# Patient Record
Sex: Male | Born: 1950 | Race: White | Hispanic: No | Marital: Married | Smoking: Current every day smoker
Health system: Southern US, Community
[De-identification: ages and names within clinical notes are randomized; demographics above are authoritative.]

## PROBLEM LIST (undated history)

## (undated) DIAGNOSIS — I209 Angina pectoris, unspecified: Secondary | ICD-10-CM

## (undated) DIAGNOSIS — C801 Malignant (primary) neoplasm, unspecified: Secondary | ICD-10-CM

## (undated) DIAGNOSIS — E039 Hypothyroidism, unspecified: Secondary | ICD-10-CM

## (undated) DIAGNOSIS — Z973 Presence of spectacles and contact lenses: Secondary | ICD-10-CM

## (undated) DIAGNOSIS — I2510 Atherosclerotic heart disease of native coronary artery without angina pectoris: Secondary | ICD-10-CM

## (undated) DIAGNOSIS — R490 Dysphonia: Secondary | ICD-10-CM

## (undated) DIAGNOSIS — M1990 Unspecified osteoarthritis, unspecified site: Secondary | ICD-10-CM

## (undated) DIAGNOSIS — I10 Essential (primary) hypertension: Secondary | ICD-10-CM

## (undated) DIAGNOSIS — I219 Acute myocardial infarction, unspecified: Secondary | ICD-10-CM

## (undated) DIAGNOSIS — R1310 Dysphagia, unspecified: Secondary | ICD-10-CM

## (undated) DIAGNOSIS — Z87891 Personal history of nicotine dependence: Secondary | ICD-10-CM

## (undated) HISTORY — DX: Personal history of nicotine dependence: Z87.891

## (undated) HISTORY — PX: BILATERAL CARPAL TUNNEL RELEASE: SHX6508

## (undated) HISTORY — PX: CORONARY ANGIOPLASTY WITH STENT PLACEMENT: SHX49

## (undated) HISTORY — PX: BACK SURGERY: SHX140

---

## 2004-07-12 ENCOUNTER — Inpatient Hospital Stay (HOSPITAL_COMMUNITY): Admission: EM | Admit: 2004-07-12 | Discharge: 2004-07-22 | Payer: Self-pay | Admitting: Cardiovascular Disease

## 2004-07-12 HISTORY — PX: CARDIAC CATHETERIZATION: SHX172

## 2004-07-17 HISTORY — PX: CORONARY ARTERY BYPASS GRAFT: SHX141

## 2004-08-13 ENCOUNTER — Encounter
Admission: RE | Admit: 2004-08-13 | Discharge: 2004-08-13 | Payer: Self-pay | Admitting: Thoracic Surgery (Cardiothoracic Vascular Surgery)

## 2006-03-10 ENCOUNTER — Ambulatory Visit: Payer: Self-pay | Admitting: Cardiology

## 2013-10-14 ENCOUNTER — Encounter (HOSPITAL_COMMUNITY)
Admission: AD | Disposition: A | Discharge status: Home / Self Care | Payer: Medicare HMO | Source: Other Acute Inpatient Hospital | Attending: Cardiology

## 2013-10-14 ENCOUNTER — Encounter (HOSPITAL_COMMUNITY): Payer: Self-pay

## 2013-10-14 ENCOUNTER — Inpatient Hospital Stay (HOSPITAL_COMMUNITY)
Admission: AD | Admit: 2013-10-14 | Discharge: 2013-10-15 | DRG: 247 | Disposition: A | Discharge status: Home / Self Care | Payer: Medicare HMO | Source: Other Acute Inpatient Hospital | Attending: Cardiology | Admitting: Cardiology

## 2013-10-14 DIAGNOSIS — I2582 Chronic total occlusion of coronary artery: Secondary | ICD-10-CM | POA: Diagnosis present

## 2013-10-14 DIAGNOSIS — Z87891 Personal history of nicotine dependence: Secondary | ICD-10-CM

## 2013-10-14 DIAGNOSIS — I2510 Atherosclerotic heart disease of native coronary artery without angina pectoris: Secondary | ICD-10-CM | POA: Diagnosis present

## 2013-10-14 DIAGNOSIS — I9589 Other hypotension: Secondary | ICD-10-CM | POA: Diagnosis not present

## 2013-10-14 DIAGNOSIS — Z79899 Other long term (current) drug therapy: Secondary | ICD-10-CM

## 2013-10-14 DIAGNOSIS — T463X5A Adverse effect of coronary vasodilators, initial encounter: Secondary | ICD-10-CM | POA: Diagnosis not present

## 2013-10-14 DIAGNOSIS — E785 Hyperlipidemia, unspecified: Secondary | ICD-10-CM | POA: Diagnosis present

## 2013-10-14 DIAGNOSIS — I214 Non-ST elevation (NSTEMI) myocardial infarction: Principal | ICD-10-CM | POA: Diagnosis present

## 2013-10-14 DIAGNOSIS — I2589 Other forms of chronic ischemic heart disease: Secondary | ICD-10-CM | POA: Diagnosis present

## 2013-10-14 DIAGNOSIS — I25810 Atherosclerosis of coronary artery bypass graft(s) without angina pectoris: Secondary | ICD-10-CM | POA: Diagnosis present

## 2013-10-14 DIAGNOSIS — Z951 Presence of aortocoronary bypass graft: Secondary | ICD-10-CM

## 2013-10-14 DIAGNOSIS — I255 Ischemic cardiomyopathy: Secondary | ICD-10-CM | POA: Diagnosis present

## 2013-10-14 DIAGNOSIS — I10 Essential (primary) hypertension: Secondary | ICD-10-CM | POA: Diagnosis present

## 2013-10-14 DIAGNOSIS — Z880 Allergy status to penicillin: Secondary | ICD-10-CM

## 2013-10-14 DIAGNOSIS — E039 Hypothyroidism, unspecified: Secondary | ICD-10-CM | POA: Diagnosis present

## 2013-10-14 DIAGNOSIS — Z7902 Long term (current) use of antithrombotics/antiplatelets: Secondary | ICD-10-CM

## 2013-10-14 DIAGNOSIS — Z9119 Patient's noncompliance with other medical treatment and regimen: Secondary | ICD-10-CM

## 2013-10-14 DIAGNOSIS — Y9210 Unspecified residential institution as the place of occurrence of the external cause: Secondary | ICD-10-CM | POA: Diagnosis not present

## 2013-10-14 DIAGNOSIS — I252 Old myocardial infarction: Secondary | ICD-10-CM

## 2013-10-14 DIAGNOSIS — Z91199 Patient's noncompliance with other medical treatment and regimen due to unspecified reason: Secondary | ICD-10-CM

## 2013-10-14 DIAGNOSIS — Z7982 Long term (current) use of aspirin: Secondary | ICD-10-CM

## 2013-10-14 HISTORY — DX: Unspecified osteoarthritis, unspecified site: M19.90

## 2013-10-14 HISTORY — DX: Acute myocardial infarction, unspecified: I21.9

## 2013-10-14 HISTORY — PX: LEFT HEART CATHETERIZATION WITH CORONARY ANGIOGRAM: SHX5451

## 2013-10-14 HISTORY — DX: Essential (primary) hypertension: I10

## 2013-10-14 HISTORY — DX: Hypothyroidism, unspecified: E03.9

## 2013-10-14 HISTORY — DX: Angina pectoris, unspecified: I20.9

## 2013-10-14 HISTORY — DX: Atherosclerotic heart disease of native coronary artery without angina pectoris: I25.10

## 2013-10-14 LAB — LIPID PANEL
Cholesterol: 145 mg/dL (ref 0–200)
HDL: 35 mg/dL — ABNORMAL LOW (ref >39)
LDL CALC: 87 mg/dL (ref 0–99)
TRIGLYCERIDES: 114 mg/dL (ref <150)
Total CHOL/HDL Ratio: 4.1 RATIO
VLDL: 23 mg/dL (ref 0–40)

## 2013-10-14 LAB — BASIC METABOLIC PANEL
BUN: 15 mg/dL (ref 6–23)
CO2: 22 meq/L (ref 19–32)
Calcium: 9 mg/dL (ref 8.4–10.5)
Chloride: 102 mEq/L (ref 96–112)
Creatinine, Ser: 0.68 mg/dL (ref 0.50–1.35)
GFR calc Af Amer: >90 mL/min (ref >90)
Glucose, Bld: 106 mg/dL — ABNORMAL HIGH (ref 70–99)
POTASSIUM: 4.8 meq/L (ref 3.7–5.3)
SODIUM: 139 meq/L (ref 137–147)

## 2013-10-14 LAB — CBC
HEMATOCRIT: 40.9 % (ref 39.0–52.0)
Hemoglobin: 14.4 g/dL (ref 13.0–17.0)
MCH: 33 pg (ref 26.0–34.0)
MCHC: 35.2 g/dL (ref 30.0–36.0)
MCV: 93.6 fL (ref 78.0–100.0)
Platelets: 202 10*3/uL (ref 150–400)
RBC: 4.37 MIL/uL (ref 4.22–5.81)
RDW: 12.6 % (ref 11.5–15.5)
WBC: 12.1 10*3/uL — AB (ref 4.0–10.5)

## 2013-10-14 LAB — PROTIME-INR
INR: 1.11 (ref 0.00–1.49)
Prothrombin Time: 14.1 seconds (ref 11.6–15.2)

## 2013-10-14 LAB — HEMOGLOBIN A1C
Hgb A1c MFr Bld: 6 % — ABNORMAL HIGH (ref <5.7)
MEAN PLASMA GLUCOSE: 126 mg/dL — AB (ref <117)

## 2013-10-14 LAB — MRSA PCR SCREENING: MRSA by PCR: NEGATIVE

## 2013-10-14 LAB — POCT ACTIVATED CLOTTING TIME
ACTIVATED CLOTTING TIME: 160 s
Activated Clotting Time: 343 seconds

## 2013-10-14 LAB — TSH: TSH: 0.755 u[IU]/mL (ref 0.350–4.500)

## 2013-10-14 LAB — TROPONIN I: Troponin I: 4.3 ng/mL (ref <0.30)

## 2013-10-14 LAB — HEPARIN LEVEL (UNFRACTIONATED): Heparin Unfractionated: 0.18 IU/mL — ABNORMAL LOW (ref 0.30–0.70)

## 2013-10-14 LAB — APTT: APTT: 56 s — AB (ref 24–37)

## 2013-10-14 SURGERY — LEFT HEART CATHETERIZATION WITH CORONARY ANGIOGRAM
Anesthesia: LOCAL

## 2013-10-14 MED ORDER — TICAGRELOR 90 MG PO TABS
180.0000 mg | ORAL_TABLET | Freq: Once | ORAL | Status: AC
  Administered 2013-10-14: 180 mg via ORAL
  Filled 2013-10-14: qty 2

## 2013-10-14 MED ORDER — MIDAZOLAM HCL 2 MG/2ML IJ SOLN
INTRAMUSCULAR | Status: AC
  Filled 2013-10-14: qty 2

## 2013-10-14 MED ORDER — NITROGLYCERIN 0.4 MG SL SUBL: 0.4000 mg | SUBLINGUAL_TABLET | SUBLINGUAL | Status: DC | PRN

## 2013-10-14 MED ORDER — HEPARIN (PORCINE) IN NACL 2-0.9 UNIT/ML-% IJ SOLN
INTRAMUSCULAR | Status: AC
  Filled 2013-10-14: qty 1500

## 2013-10-14 MED ORDER — SODIUM CHLORIDE 0.9 % IV SOLN
0.2500 mg/kg/h | INTRAVENOUS | Status: DC
  Administered 2013-10-14: 0.25 mg/kg/h via INTRAVENOUS
  Filled 2013-10-14: qty 250

## 2013-10-14 MED ORDER — SODIUM CHLORIDE 0.9 % IJ SOLN
3.0000 mL | Freq: Two times a day (BID) | INTRAMUSCULAR | Status: DC
  Administered 2013-10-14 (×2): 3 mL via INTRAVENOUS

## 2013-10-14 MED ORDER — HEPARIN (PORCINE) IN NACL 100-0.45 UNIT/ML-% IJ SOLN
1550.0000 [IU]/h | INTRAMUSCULAR | Status: DC
  Filled 2013-10-14: qty 250

## 2013-10-14 MED ORDER — BIVALIRUDIN 250 MG IV SOLR
INTRAVENOUS | Status: AC
  Filled 2013-10-14: qty 250

## 2013-10-14 MED ORDER — SODIUM CHLORIDE 0.9 % IJ SOLN: 3.0000 mL | INTRAMUSCULAR | Status: DC | PRN

## 2013-10-14 MED ORDER — NITROGLYCERIN IN D5W 200-5 MCG/ML-% IV SOLN
2.0000 ug/min | INTRAVENOUS | Status: DC
  Administered 2013-10-14: 10 ug/min via INTRAVENOUS
  Administered 2013-10-15: 20 ug/min via INTRAVENOUS
  Filled 2013-10-14: qty 250

## 2013-10-14 MED ORDER — ASPIRIN EC 81 MG PO TBEC
81.0000 mg | DELAYED_RELEASE_TABLET | Freq: Every day | ORAL | Status: DC
  Administered 2013-10-15: 81 mg via ORAL
  Filled 2013-10-14: qty 1

## 2013-10-14 MED ORDER — MORPHINE SULFATE 2 MG/ML IJ SOLN
INTRAMUSCULAR | Status: AC
  Administered 2013-10-14: 2 mg via INTRAVENOUS
  Filled 2013-10-14: qty 1

## 2013-10-14 MED ORDER — TICAGRELOR 90 MG PO TABS: 90.0000 mg | ORAL_TABLET | Freq: Two times a day (BID) | ORAL | Status: DC

## 2013-10-14 MED ORDER — MORPHINE SULFATE 2 MG/ML IJ SOLN
2.0000 mg | Freq: Once | INTRAMUSCULAR | Status: AC
  Administered 2013-10-14: 2 mg via INTRAVENOUS

## 2013-10-14 MED ORDER — NITROGLYCERIN 0.2 MG/ML ON CALL CATH LAB
INTRAVENOUS | Status: AC
  Filled 2013-10-14: qty 1

## 2013-10-14 MED ORDER — SODIUM CHLORIDE 0.9 % IV SOLN
250.0000 mL | INTRAVENOUS | Status: DC | PRN
  Administered 2013-10-14: 250 mL via INTRAVENOUS

## 2013-10-14 MED ORDER — ACETAMINOPHEN 325 MG PO TABS
650.0000 mg | ORAL_TABLET | ORAL | Status: DC | PRN
Start: 2013-10-14 — End: 2013-10-15
  Administered 2013-10-14: 650 mg via ORAL
  Filled 2013-10-14: qty 2

## 2013-10-14 MED ORDER — ATORVASTATIN CALCIUM 80 MG PO TABS
80.0000 mg | ORAL_TABLET | Freq: Every day | ORAL | Status: DC
  Administered 2013-10-14: 80 mg via ORAL
  Filled 2013-10-14 (×2): qty 1

## 2013-10-14 MED ORDER — ACETAMINOPHEN 325 MG PO TABS: 650.0000 mg | ORAL_TABLET | ORAL | Status: DC | PRN

## 2013-10-14 MED ORDER — FENTANYL CITRATE 0.05 MG/ML IJ SOLN
INTRAMUSCULAR | Status: AC
  Filled 2013-10-14: qty 2

## 2013-10-14 MED ORDER — HEPARIN (PORCINE) IN NACL 100-0.45 UNIT/ML-% IJ SOLN
1250.0000 [IU]/h | INTRAMUSCULAR | Status: DC
  Administered 2013-10-14: 1250 [IU]/h via INTRAVENOUS
  Filled 2013-10-14: qty 250

## 2013-10-14 MED ORDER — LIDOCAINE HCL (PF) 1 % IJ SOLN
INTRAMUSCULAR | Status: AC
  Filled 2013-10-14: qty 30

## 2013-10-14 MED ORDER — ONDANSETRON HCL 4 MG/2ML IJ SOLN
4.0000 mg | Freq: Four times a day (QID) | INTRAMUSCULAR | Status: DC | PRN
Start: 2013-10-14 — End: 2013-10-14

## 2013-10-14 MED ORDER — SODIUM CHLORIDE 0.9 % IV SOLN: 250.0000 mL | INTRAVENOUS | Status: DC | PRN

## 2013-10-14 MED ORDER — LEVOTHYROXINE SODIUM 150 MCG PO TABS
150.0000 ug | ORAL_TABLET | Freq: Every day | ORAL | Status: DC
Start: 2013-10-14 — End: 2013-10-15
  Administered 2013-10-14 – 2013-10-15 (×2): 150 ug via ORAL
  Filled 2013-10-14 (×3): qty 1

## 2013-10-14 MED ORDER — ONDANSETRON HCL 4 MG/2ML IJ SOLN: 4.0000 mg | Freq: Four times a day (QID) | INTRAMUSCULAR | Status: DC | PRN

## 2013-10-14 MED ORDER — SODIUM CHLORIDE 0.9 % IV SOLN
INTRAVENOUS | Status: DC
  Administered 2013-10-14: 17:00:00 via INTRAVENOUS

## 2013-10-14 MED ORDER — SODIUM CHLORIDE 0.9 % IJ SOLN: 3.0000 mL | Freq: Two times a day (BID) | INTRAMUSCULAR | Status: DC

## 2013-10-14 MED ORDER — NITROGLYCERIN IN D5W 200-5 MCG/ML-% IV SOLN
2.0000 ug/min | INTRAVENOUS | Status: DC
  Administered 2013-10-14: 40 ug/min via INTRAVENOUS

## 2013-10-14 MED ORDER — HEPARIN BOLUS VIA INFUSION
2500.0000 [IU] | Freq: Once | INTRAVENOUS | Status: AC
  Administered 2013-10-14: 2500 [IU] via INTRAVENOUS
  Filled 2013-10-14: qty 2500

## 2013-10-14 MED ORDER — ASPIRIN EC 81 MG PO TBEC: 81.0000 mg | DELAYED_RELEASE_TABLET | Freq: Every day | ORAL | Status: DC

## 2013-10-14 MED ORDER — SODIUM CHLORIDE 0.9 % IJ SOLN
3.0000 mL | INTRAMUSCULAR | Status: DC | PRN
Start: 2013-10-14 — End: 2013-10-14

## 2013-10-14 MED ORDER — SODIUM CHLORIDE 0.9 % IV SOLN
1.0000 mL/kg/h | INTRAVENOUS | Status: DC
  Administered 2013-10-14: 1 mL/kg/h via INTRAVENOUS

## 2013-10-14 MED ORDER — TICAGRELOR 90 MG PO TABS
90.0000 mg | ORAL_TABLET | Freq: Two times a day (BID) | ORAL | Status: DC
  Administered 2013-10-14 – 2013-10-15 (×3): 90 mg via ORAL
  Filled 2013-10-14 (×4): qty 1

## 2013-10-14 MED FILL — Nitroglycerin IV Soln 200 MCG/ML in D5W: INTRAVENOUS | Qty: 250 | Status: AC

## 2013-10-14 MED FILL — Heparin Sodium (Porcine) 100 Unt/ML in Sodium Chloride 0.45%: INTRAMUSCULAR | Qty: 250 | Status: AC

## 2013-10-14 NOTE — Consult Note (Deleted)
12 lead ekg performed. Critical value noted. Jennye Moccasin RN notified

## 2013-10-14 NOTE — CV Procedure (Signed)
Jerry Cardenas is a 63 y.o. male    [id number]  [id number] LOCATION:  FACILITY: [facility]  PHYSICIAN: Jerry Sine, MD, Jerry Cardenas 11-10-1950   DATE OF PROCEDURE:  10/14/2013    [facility] CATHETERIZATION     HISTORY:  [name] is a 63 year old male with a past has seen [facility]. The patient underwent CABG revascularization Jerry in 2005 by [facility] and had a LIMA placed to his LAD, and SVG to diagonal, and SVG to the circumflex marginal vessel, and an SVG to his distal right coronary artery. He had not seen [facility] in several years. He presented to [facility] yesterday with 3 days of stuttering chest pain and diffuse less than 1 mm ST segment depression. Troponin was positive for non-ST segment elevation MI. He was transported to [facility] and was scheduled for definitive [facility] catheterization today.   PROCEDURE:  The patient was brought to the second floor [facility] cath lab in the postabsorptive state. He was premedicated with Versed 2 mg and fentanyl 50 mcg. His right groin was prepped and shaved in usual sterile fashion. A 5 French sheath was inserted into the right femoral artery without difficulty. Diagnostic catheterization was done using 5 French Judkins 4 left and right catheters. The right coronary catheter was used for selective angiography into the 3 vein grafts arising from the aorta. A LIMA catheter was necessary for selective angiography into the left internal mammary artery. A 5 French pigtail catheter was used for RAO ventriculography.  With the demonstration of an apparent very recent occlusion of the vein graft supplying the right coronary artery with no visualization beyond the mid segment but with vascillating flow  suggestive of distal occlusion the decision was made to attempt intervention. The sheath was upgraded to a 6 [place] system. Angiomax bolus plus infusion was administered. Apparently, the patient had already received  a180 mg loading dose of Brilinta last night and later did receive an additional 90 mg oral dose. He also had received aspirin. A 6 F right guiding catheter was used after a right bypass graft catheter could not optimally engaged the graft. A  prowater wire was advanced into the SVG but this was unable to cross the distal occlusion. A light support Choice PT wire was then used and successfully able to cross the total distal occlusion with support from a 2.0x12 mm Emerge Monorail balloon. Several dilatations were done at the site of distal occlusion of the 5 atmospheres. This was then upgraded to a 2.5x20 mm balloon and 4 inflations to 10 atmospheres were made. There was an ectatic segment just proximal to the high-grade residual narrowing it was felt that the stent to be inserted would need to not being placed in this ectatic segment. A 3.0x33 mm findings outlined DES stent was then inserted and 2 inflations at 13 and 14 atmospheres were performed. A 3.25x20 mm [facility] Euphora balloon was used for post dilatation up to approximately 3.25 mm. Scout angiography confirmed an excellent angiographic result. During the procedure the patient  was started on intravenous nitroglycerin and titrated up to 30 mcg and also received numerous 200 mcg injections selectively into the SVG to the RCA. He left the catheterization laboratory with stable hemodynamics and chest pain-free. The Angiomax dose was reduced with plans to continue this reduced dose for 2 hours prior to sheath removal.  HEMODYNAMICS:   Central aortic pressure:  166/86    Left ventricular pressure:  166/16/28  ANGIOGRAPHY:  1. Left Main: Angiographically normal and extended into the LAD which gave rise to a very proximal LAD collateral to the circumflex. 2. LAD: Diffuse narrowing of 80% before the first septal perforating artery and small first diagonal vessel and then the LAD was totally occluded after the septal perforating artery. 3. Left circumflex:  Occluded at the ostium arising from the left main; however, there was a faint collateral seen to arise from an very small diagonal like branch which then anastomosed into the AV groove circumflex. There was distal collateralization to the RCA from the distal circumflex. The OM branch arising from the mid circumflex was not visualized from the AV groove circumflex. 4. Right coronary artery: Diffusely diseased proximal vessel with mid occlusion after an ectatic segment and entry into RV marginal branch.  5.LIMA TO LAD:  Widely patent and anastomosed to the mid LAD the distal LAD was free of significant disease and extended to the LV apex. There was retrograde filling of the LAD proximal to the graft anastomosis which also supplied septal perforating artery. 6. SVG TO diagonal vessel was widely patent. 7. SVG TO obtuse marginal vessel was widely patent. 8. SVG TO the right radial artery was occluded. Flow stopped in the mid segment but there was an ebh and flow  in this mid segment suggesting a more distal occlusion     Left ventriculography revealed global ejection fraction of approximately 40%. There was severe hypokinesis involving the inferior wall from the basal segment to the mid region.   Following successful percutaneous coronary intervention to the saphenous vein graft to the RCA with initial PTCA utilizing a 2.0x12 mm Emerge balloon upgraded to a 2.5x12 mm balloon, a 3.0x33 mm Xience Alpine DES stent was inserted in the distal aspect of the graft and was positioned just beyond an ectatic segment and was post dilated successfully to 3.25 mm with the residual stenosis being reduced to 0% and brisk TIMI-3 flow. The proximal portion of the vein graft did have diffuse irregularity and areas of ectasia there was a 60% narrowing in the midsegment proximal to another ectatic segment which was not intervened upon.  IMPRESSION:  Moderate LV dysfunction with severe hypo-kinesis involving the basal to mid  inferior wall with a ejection fraction of approximately 40%.  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  Patent left internal mammary artery graft supplying the mid LAD   Patent saphenous vein graft supplying the diagonal vessel  Patent saphenous vein graft supplying the circumflex marginal vessel  Recently occluded saphenous vein graft supplying the distal RCA.  Successful percutaneous coronary intervention to the vein graft supplying the RCA with PTCA/DES stenting with a 3.0x33 mm Xience Alpine DES stent postdilated 3.25 mm with distal 100% occlusion and TIMI 0 flow being reduced to 0% and TIMI 3 flow, and evidence for diffuse disease in the proximal to mid segment with areas of ectasia and narrowing of 60% in the mid vessel which was not intervened upon.  Anticoagulation/antiplatelet therapy with bivalirudin/brilinta in addition to IC/IV nitroglycerin.  Jerry Sine, MD, [facility] 10/14/2013 4:57 PM

## 2013-10-14 NOTE — H&P (Signed)
Admission History and Physical     Patient ID: Jerry Cardenas, MRN: [id number], DOB: 14-Dec-1950 63 y.o. Date of Encounter: 10/14/2013, 1:16 AM  Primary Physician: No primary provider on file. Primary Cardiologist: None  Chief Complaint: Chest Pain  History of Present Illness: Jerry Cardenas is a 63 y.o. male s/p CABG done at [facility] in 2005, who presents with 3 days of stuttering chest pain, found to have NSTEMI.  He states he was in his usual state of health until 3 days ago, when he noticed the onset of his chest pain, described as a burning indigestion, similar to chest discomfort at time of CABG.  Stuttering, and not necessarily worse with exertion, but did get worse over time and radiated to his jaw and arm.  He presented to [facility] ED where his EKG showed diffuse <48mm ST depression, and Troponin was positive.  Persistent chest pain requiring NTG gtt.  He initially became hypotensive when NTG was started but recovered with a fluid bolus.  Heparin gtt started as well, aspirin given.  On arrival to [facility], he has very mild chest pain (indigestion) with stable vitals, but with headache from NTG.  He denies GI bleeding, SOB, N/V, F/C, orthopnea, edema, or PND.  He is typically fairly active, doing yard work, Social research officer, government.  He lives with his wife at home.  He smokes 5 cigars per day, no EtOH.  Of note he does not have a cardiologist currently, and has not been on aspirin for quite some time.   He does take meloxicam for pain.  His last stress test was ~ 2010 and normal.   Past Medical History  Diagnosis Date  . Arthritis   . Hypertension   . Coronary artery disease   . Myocardial infarction   . Hypothyroidism   . Anginal pain      Past Surgical History  Procedure Laterality Date  . Coronary artery bypass graft    . Cardiac catheterization    . Back surgery        Current Facility-Administered Medications  Medication Dose Route Frequency Provider Last Rate Last Dose  .  acetaminophen (TYLENOL) tablet 650 mg  650 mg Oral Q4H PRN Stephani Police, MD      . Derrill Memo ON 10/15/2013] aspirin EC tablet 81 mg  81 mg Oral Daily Stephani Police, MD      . atorvastatin (LIPITOR) tablet 80 mg  80 mg Oral q1800 Stephani Police, MD      . nitroGLYCERIN 0.2 mg/mL in dextrose 5 % infusion  2-200 mcg/min Intravenous Titrated Stephani Police, MD      . ondansetron [facility]) injection 4 mg  4 mg Intravenous Q6H PRN Stephani Police, MD      . Ticagrelor [facility]) tablet 180 mg  180 mg Oral Once Stephani Police, MD      . Ticagrelor [facility]) tablet 90 mg  90 mg Oral BID Stephani Police, MD          Allergies: Allergies  Allergen Reactions  . Penicillins     Swelling in extremeties     Social History:  The patient  reports that he quit smoking about 15 years ago. He has quit using smokeless tobacco. He reports that he does not use illicit drugs.   Family History:  The patient's family history is not on file.   ROS:  Please see the history of present illness.   All other systems reviewed and negative.   Vital Signs: Temperature 98.4 F (36.9  C), temperature source Oral, height 6' (1.829 m), weight 107.3 kg (236 lb 8.9 oz).  PHYSICAL EXAM: General:  Well nourished, well developed, in no acute distress HEENT: normal Lymph: no adenopathy Neck: no JVD Endocrine:  No thryomegaly Vascular: No carotid bruits; FA pulses 2+ bilaterally without bruits Cardiac:  normal S1, S2; RRR; no murmur Lungs:  clear to auscultation bilaterally, no wheezing, rhonchi or rales Abd: soft, nontender, no hepatomegaly Ext: no edema Musculoskeletal:  No deformities, BUE and BLE strength normal and equal Skin: warm and dry Neuro:  CNs 2-12 intact, no focal abnormalities noted Psych:  Normal affect   EKG:  (From [place]) NSR, <40mm diffuse ST depression, 70mm elevation in AVR.  Labs [facility]):  Na 134 K 3.9 BUN 17 Cr 0.88 Mg 1.9 Trop I:  0.47 (ULN 0.03) CKMB 9.9, CK 139 BNP 59 INR 1.1, PT 14.5, PTT  33.3 WBC 11.5 Hgb 15.5 PLT 224  Radiology/Studies:  No results found.   ASSESSMENT AND PLAN:   1. NSTEMI S/P CABG in 2005 - His CABG was performed here at [facility] so hopefully we have access to Op report, but I do not see anything in epic. - Will plan on cath later today.  He is NPO. - Continue Heparin gtt, ASA 81 daily. - Start Ticagrelor - NTG gtt, titrate to effect.  Tylenol PRN associated headache.   - Repeat labs including Troponin in AM   Signed,  Stephani Police, MD 10/14/2013, 1:16 AM

## 2013-10-14 NOTE — Progress Notes (Signed)
ANTICOAGULATION CONSULT NOTE - Initial Consult  Pharmacy Consult for Heparin Indication: chest pain/ACS  Allergies  Allergen Reactions  . Penicillins     Swelling in extremeties    Patient Measurements: Height: 6' (182.9 cm) Weight: 236 lb 8.9 oz (107.3 kg) IBW/kg (Calculated) : 77.6 Heparin Dosing Weight: 100 kg  Vital Signs: Temp: 98.4 F (36.9 C) (01/29 0000) Temp src: Oral (01/29 0000)  Labs (at [facility]): WBC  11.5 Hgb  15.5 Hct  45.1 Plt  223  SCr  0.88  No results found for this basename: HGB, HCT, PLT, APTT, LABPROT, INR, HEPARINUNFRC, CREATININE, CKTOTAL, CKMB, TROPONINI,  in the last 72 hours  CrCl is unknown because no creatinine reading has been taken.   Medical History: Past Medical History  Diagnosis Date  . Arthritis   . Hypertension   . Coronary artery disease   . Myocardial infarction   . Hypothyroidism   . Anginal pain     Medications:  Mobic  Coreg  Lipitor  Synthroid    Assessment: 63 yo male with NSTEMI for heparin.  Heparin 5000 units IV bolus, 1000 units/hr started at [facility] ~ 10pm  Goal of Therapy:  Heparin level 0.3-0.7 units/ml Monitor platelets by anticoagulation protocol: Yes   Plan:  Increase Heparin 1250 units/hr  Zoejane Gaulin, Bronson Curb 10/14/2013,1:26 AM

## 2013-10-14 NOTE — Interval H&P Note (Signed)
History and Physical Interval Note:  10/14/2013 1:26 PM  Royetta Asal  has presented today for surgery, with the diagnosis of cp  The various methods of treatment have been discussed with the patient and family. After consideration of risks, benefits and other options for treatment, the patient has consented to  Procedure(s): LEFT HEART CATHETERIZATION WITH CORONARY ANGIOGRAM (N/A) and possible PCI Cath Lab Visit (complete for each Cath Lab visit)  Clinical Evaluation Leading to the Procedure:   ACS: yes  Non-ACS:    Anginal Classification: CCS IV  Anti-ischemic medical therapy: Maximal Therapy (2 or more classes of medications)  Non-Invasive Test Results: No non-invasive testing performed  Prior CABG: Previous CABG      as a surgical intervention .  The patient's history has been reviewed, patient examined, no change in status, stable for surgery.  I have reviewed the patient's chart and labs.  Questions were answered to the patient's satisfaction.     KELLY,THOMAS A

## 2013-10-14 NOTE — H&P (View-Only) (Signed)
DAILY PROGRESS NOTE  Subjective:  No events overnight. Troponin elevated to 4.3.  He has been having chest pain for several days. Has not seen a cardiologist in follow-up in "years" since his CABG in 2005. Non-compliant with PCP follow-ups as well.  Currently he is chest pain free on heparin and nitroglycerin gtts.  Objective:  Temp:  [97.9 F (36.6 C)-98.4 F (36.9 C)] 97.9 F (36.6 C) (01/29 0700) Pulse Rate:  [56-70] 69 (01/29 0800) Resp:  [12-22] 14 (01/29 0800) BP: (105-153)/(56-81) 153/73 mmHg (01/29 0800) SpO2:  [95 %-99 %] 97 % (01/29 0800) Weight:  [236 lb 8.9 oz (107.3 kg)] 236 lb 8.9 oz (107.3 kg) (01/29 0000) Weight change:   Intake/Output from previous day: 01/28 0701 - 01/29 0700 In: 95.5 [I.V.:95.5] Out: 400 [Urine:400]  Intake/Output from this shift: Total I/O In: -  Out: 400 [Urine:400]  Medications: Current Facility-Administered Medications  Medication Dose Route Frequency Provider Last Rate Last Dose  . 0.9 %  sodium chloride infusion  250 mL Intravenous PRN Stephani Police, MD 10 mL/hr at 10/14/13 0208 250 mL at 10/14/13 0208  . acetaminophen (TYLENOL) tablet 650 mg  650 mg Oral Q4H PRN Stephani Police, MD   650 mg at 10/14/13 0127  . [START ON 10/15/2013] aspirin EC tablet 81 mg  81 mg Oral Daily Stephani Police, MD      . atorvastatin (LIPITOR) tablet 80 mg  80 mg Oral q1800 Stephani Police, MD      . heparin ADULT infusion 100 units/mL (25000 units/250 mL)  1,550 Units/hr Intravenous Continuous Rocky Crafts Hammons, RPH 15.5 mL/hr at 10/14/13 0931 1,550 Units/hr at 10/14/13 0931  . levothyroxine (SYNTHROID, LEVOTHROID) tablet 150 mcg  150 mcg Oral QAC breakfast Stephani Police, MD   150 mcg at 10/14/13 0834  . nitroGLYCERIN 0.2 mg/mL in dextrose 5 % infusion  2-200 mcg/min Intravenous Titrated Stephani Police, MD 1.5 mL/hr at 10/14/13 0210 5 mcg/min at 10/14/13 0210  . ondansetron (ZOFRAN) injection 4 mg  4 mg Intravenous Q6H PRN Stephani Police, MD      . sodium  chloride 0.9 % injection 3 mL  3 mL Intravenous Q12H Stephani Police, MD   3 mL at 10/14/13 0933  . sodium chloride 0.9 % injection 3 mL  3 mL Intravenous PRN Stephani Police, MD      . Ticagrelor [facility]) tablet 90 mg  90 mg Oral BID Stephani Police, MD   90 mg at 10/14/13 4827    Physical Exam: General appearance: alert and no distress Neck: no carotid bruit and no JVD Lungs: clear to auscultation bilaterally Heart: regular rate and rhythm, S1, S2 normal, no murmur, click, rub or gallop Abdomen: soft, non-tender; bowel sounds normal; no masses,  no organomegaly Extremities: extremities normal, atraumatic, no cyanosis or edema Pulses: 2+ and symmetric Skin: Skin color, texture, turgor normal. No rashes or lesions Neurologic: Grossly normal Psych: Pleasant  Lab Results: Results for orders placed during the hospital encounter of 10/14/13 (from the past 48 hour(s))  MRSA PCR SCREENING     Status: None   Collection Time    10/14/13 12:18 AM      Result Value Range   MRSA by PCR NEGATIVE  NEGATIVE   Comment:            The GeneXpert MRSA Assay (FDA     approved for NASAL specimens     only), is one component of a     comprehensive MRSA colonization  surveillance program. It is not     intended to diagnose MRSA     infection nor to guide or     monitor treatment for     MRSA infections.  LIPID PANEL     Status: Abnormal   Collection Time    10/14/13  2:20 AM      Result Value Range   Cholesterol 145  0 - 200 mg/dL   Triglycerides 114  <150 mg/dL   HDL 35 (*) >39 mg/dL   Total CHOL/HDL Ratio 4.1     VLDL 23  0 - 40 mg/dL   LDL Cholesterol 87  0 - 99 mg/dL   Comment:            Total Cholesterol/HDL:CHD Risk     Coronary Heart Disease Risk Table                         Men   Women      1/2 Average Risk   3.4   3.3      Average Risk       5.0   4.4      2 X Average Risk   9.6   7.1      3 X Average Risk  23.4   11.0                Use the calculated Patient Ratio     above  and the CHD Risk Table     to determine the patient's CHD Risk.                ATP III CLASSIFICATION (LDL):      <100     mg/dL   Optimal      100-129  mg/dL   Near or Above                        Optimal      130-159  mg/dL   Borderline      160-189  mg/dL   High      >190     mg/dL   Very High  CBC     Status: Abnormal   Collection Time    10/14/13  2:20 AM      Result Value Range   WBC 12.1 (*) 4.0 - 10.5 K/uL   RBC 4.37  4.22 - 5.81 MIL/uL   Hemoglobin 14.4  13.0 - 17.0 g/dL   HCT 40.9  39.0 - 52.0 %   MCV 93.6  78.0 - 100.0 fL   MCH 33.0  26.0 - 34.0 pg   MCHC 35.2  30.0 - 36.0 g/dL   RDW 12.6  11.5 - 15.5 %   Platelets 202  150 - 400 K/uL  BASIC METABOLIC PANEL     Status: Abnormal   Collection Time    10/14/13  2:20 AM      Result Value Range   Sodium 139  137 - 147 mEq/L   Potassium 4.8  3.7 - 5.3 mEq/L   Chloride 102  96 - 112 mEq/L   CO2 22  19 - 32 mEq/L   Glucose, Bld 106 (*) 70 - 99 mg/dL   BUN 15  6 - 23 mg/dL   Creatinine, Ser 0.68  0.50 - 1.35 mg/dL   Calcium 9.0  8.4 - 10.5 mg/dL   GFR calc non Af Amer >90  >90 mL/min   GFR  calc Af Amer >90  >90 mL/min   Comment: (NOTE)     The eGFR has been calculated using the CKD EPI equation.     This calculation has not been validated in all clinical situations.     eGFR's persistently <90 mL/min signify possible Chronic Kidney     Disease.  PROTIME-INR     Status: None   Collection Time    10/14/13  2:20 AM      Result Value Range   Prothrombin Time 14.1  11.6 - 15.2 seconds   INR 1.11  0.00 - 1.49  APTT     Status: Abnormal   Collection Time    10/14/13  2:20 AM      Result Value Range   aPTT 56 (*) 24 - 37 seconds   Comment:            IF BASELINE aPTT IS ELEVATED,     SUGGEST PATIENT RISK ASSESSMENT     BE USED TO DETERMINE APPROPRIATE     ANTICOAGULANT THERAPY.  TROPONIN I     Status: Abnormal   Collection Time    10/14/13  2:20 AM      Result Value Range   Troponin I 4.30 (*) <0.30 ng/mL    Comment:            Due to the release kinetics of cTnI,     a negative result within the first hours     of the onset of symptoms does not rule out     myocardial infarction with certainty.     If myocardial infarction is still suspected,     repeat the test at appropriate intervals.     CRITICAL RESULT CALLED TO, READ BACK BY AND VERIFIED WITH:     ABERION,S RN 10/14/2013 0507 JORDANS  HEPARIN LEVEL (UNFRACTIONATED)     Status: Abnormal   Collection Time    10/14/13  7:45 AM      Result Value Range   Heparin Unfractionated 0.18 (*) 0.30 - 0.70 IU/mL   Comment:            IF HEPARIN RESULTS ARE BELOW     EXPECTED VALUES, AND PATIENT     DOSAGE HAS BEEN CONFIRMED,     SUGGEST FOLLOW UP TESTING     OF ANTITHROMBIN III LEVELS.    Imaging: No results found.  Assessment:  Principal Problem:   NSTEMI (non-ST elevated myocardial infarction) Active Problems:   Hx of CABG   Plan:  1. Will try to obtain records into the EPIC system regarding bypass. Plan for [facility] today for NSTEMI.  He is agreeable to this and NPO. LDL is 87 this am - on lipitor 80 mg.  He has been loaded on Brillinta and aspirin.   Time Spent Directly with Patient:  15 minutes  Length of Stay:  LOS: 0 days   Pixie Casino, MD, [facility] Attending Cardiologist [facility]  Blu Lori C 10/14/2013, 10:33 AM

## 2013-10-14 NOTE — Progress Notes (Signed)
CRITICAL VALUE ALERT  Critical value received:  Troponin 4.30  Date of notification:  10/14/13  Time of notification:  0509  Critical value read back:yes  Nurse who received alert:  Dennison Mascot  MD notified (1st page):  Expected value

## 2013-10-14 NOTE — Progress Notes (Signed)
ANTICOAGULATION CONSULT NOTE - Follow Up Consult  Pharmacy Consult for Heparin Indication: NSTEMI  Allergies  Allergen Reactions  . Penicillins     Swelling in extremeties    Patient Measurements: Height: 6' (182.9 cm) Weight: 236 lb 8.9 oz (107.3 kg) IBW/kg (Calculated) : 77.6 Heparin Dosing Weight: 100 kg  Vital Signs: Temp: 97.9 F (36.6 C) (01/29 0700) Temp src: Oral (01/29 0700) BP: 153/73 mmHg (01/29 0800) Pulse Rate: 69 (01/29 0800)  Labs:  Recent Labs  10/14/13 0220 10/14/13 0745  HGB 14.4  --   HCT 40.9  --   PLT 202  --   APTT 56*  --   LABPROT 14.1  --   INR 1.11  --   HEPARINUNFRC  --  0.18*  CREATININE 0.68  --   TROPONINI 4.30*  --     Estimated Creatinine Clearance: 121.2 ml/min (by C-G formula based on Cr of 0.68).   Medications:  Scheduled:  . [START ON 10/15/2013] aspirin EC  81 mg Oral Daily  . atorvastatin  80 mg Oral q1800  . levothyroxine  150 mcg Oral QAC breakfast  . sodium chloride  3 mL Intravenous Q12H  . Ticagrelor  90 mg Oral BID   Infusions:  . heparin 1,250 Units/hr (10/14/13 0205)  . nitroGLYCERIN 5 mcg/min (10/14/13 0210)    Assessment: 63 yo M transferred from [facility] with elevated troponin and NSTEMI.  Pt was started on heparin at [facility] with infusion rate increased on transfer to [facility].  Heparin level is subtherapeutic on 1250 units/hr.  Will increase heparin infusion accordingly.  Noted start of Ticagrelor and plans for cath today.    Goal of Therapy:  Heparin level 0.3-0.7 units/ml Monitor platelets by anticoagulation protocol: Yes   Plan:  Heparin 2500 unit IV bolus x 1 Increase heparin infusion to 1550 units/hr Heparin level in 6 hours Follow-up after cath  [facility], Pharm.D., BCPS Clinical Pharmacist Pager [phone] 10/14/2013 9:09 AM

## 2013-10-14 NOTE — Progress Notes (Signed)
DAILY PROGRESS NOTE  Subjective:  No events overnight. Troponin elevated to 4.3.  He has been having chest pain for several days. Has not seen a cardiologist in follow-up in "years" since his CABG in 2005. Non-compliant with PCP follow-ups as well.  Currently he is chest pain free on heparin and nitroglycerin gtts.  Objective:  Temp:  [97.9 F (36.6 C)-98.4 F (36.9 C)] 97.9 F (36.6 C) (01/29 0700) Pulse Rate:  [56-70] 69 (01/29 0800) Resp:  [12-22] 14 (01/29 0800) BP: (105-153)/(56-81) 153/73 mmHg (01/29 0800) SpO2:  [95 %-99 %] 97 % (01/29 0800) Weight:  [236 lb 8.9 oz (107.3 kg)] 236 lb 8.9 oz (107.3 kg) (01/29 0000) Weight change:   Intake/Output from previous day: 01/28 0701 - 01/29 0700 In: 95.5 [I.V.:95.5] Out: 400 [Urine:400]  Intake/Output from this shift: Total I/O In: -  Out: 400 [Urine:400]  Medications: Current Facility-Administered Medications  Medication Dose Route Frequency Provider Last Rate Last Dose  . 0.9 %  sodium chloride infusion  250 mL Intravenous PRN Stephani Police, MD 10 mL/hr at 10/14/13 0208 250 mL at 10/14/13 0208  . acetaminophen (TYLENOL) tablet 650 mg  650 mg Oral Q4H PRN Stephani Police, MD   650 mg at 10/14/13 0127  . [START ON 10/15/2013] aspirin EC tablet 81 mg  81 mg Oral Daily Stephani Police, MD      . atorvastatin (LIPITOR) tablet 80 mg  80 mg Oral q1800 Stephani Police, MD      . heparin ADULT infusion 100 units/mL (25000 units/250 mL)  1,550 Units/hr Intravenous Continuous Rocky Crafts Hammons, RPH 15.5 mL/hr at 10/14/13 0931 1,550 Units/hr at 10/14/13 0931  . levothyroxine (SYNTHROID, LEVOTHROID) tablet 150 mcg  150 mcg Oral QAC breakfast Stephani Police, MD   150 mcg at 10/14/13 0834  . nitroGLYCERIN 0.2 mg/mL in dextrose 5 % infusion  2-200 mcg/min Intravenous Titrated Stephani Police, MD 1.5 mL/hr at 10/14/13 0210 5 mcg/min at 10/14/13 0210  . ondansetron (ZOFRAN) injection 4 mg  4 mg Intravenous Q6H PRN Stephani Police, MD      . sodium  chloride 0.9 % injection 3 mL  3 mL Intravenous Q12H Stephani Police, MD   3 mL at 10/14/13 0933  . sodium chloride 0.9 % injection 3 mL  3 mL Intravenous PRN Stephani Police, MD      . Ticagrelor [facility]) tablet 90 mg  90 mg Oral BID Stephani Police, MD   90 mg at 10/14/13 4827    Physical Exam: General appearance: alert and no distress Neck: no carotid bruit and no JVD Lungs: clear to auscultation bilaterally Heart: regular rate and rhythm, S1, S2 normal, no murmur, click, rub or gallop Abdomen: soft, non-tender; bowel sounds normal; no masses,  no organomegaly Extremities: extremities normal, atraumatic, no cyanosis or edema Pulses: 2+ and symmetric Skin: Skin color, texture, turgor normal. No rashes or lesions Neurologic: Grossly normal Psych: Pleasant  Lab Results: Results for orders placed during the hospital encounter of 10/14/13 (from the past 48 hour(s))  MRSA PCR SCREENING     Status: None   Collection Time    10/14/13 12:18 AM      Result Value Range   MRSA by PCR NEGATIVE  NEGATIVE   Comment:            The GeneXpert MRSA Assay (FDA     approved for NASAL specimens     only), is one component of a     comprehensive MRSA colonization  surveillance program. It is not     intended to diagnose MRSA     infection nor to guide or     monitor treatment for     MRSA infections.  LIPID PANEL     Status: Abnormal   Collection Time    10/14/13  2:20 AM      Result Value Range   Cholesterol 145  0 - 200 mg/dL   Triglycerides 114  <150 mg/dL   HDL 35 (*) >39 mg/dL   Total CHOL/HDL Ratio 4.1     VLDL 23  0 - 40 mg/dL   LDL Cholesterol 87  0 - 99 mg/dL   Comment:            Total Cholesterol/HDL:CHD Risk     Coronary Heart Disease Risk Table                         Men   Women      1/2 Average Risk   3.4   3.3      Average Risk       5.0   4.4      2 X Average Risk   9.6   7.1      3 X Average Risk  23.4   11.0                Use the calculated Patient Ratio     above  and the CHD Risk Table     to determine the patient's CHD Risk.                ATP III CLASSIFICATION (LDL):      <100     mg/dL   Optimal      100-129  mg/dL   Near or Above                        Optimal      130-159  mg/dL   Borderline      160-189  mg/dL   High      >190     mg/dL   Very High  CBC     Status: Abnormal   Collection Time    10/14/13  2:20 AM      Result Value Range   WBC 12.1 (*) 4.0 - 10.5 K/uL   RBC 4.37  4.22 - 5.81 MIL/uL   Hemoglobin 14.4  13.0 - 17.0 g/dL   HCT 40.9  39.0 - 52.0 %   MCV 93.6  78.0 - 100.0 fL   MCH 33.0  26.0 - 34.0 pg   MCHC 35.2  30.0 - 36.0 g/dL   RDW 12.6  11.5 - 15.5 %   Platelets 202  150 - 400 K/uL  BASIC METABOLIC PANEL     Status: Abnormal   Collection Time    10/14/13  2:20 AM      Result Value Range   Sodium 139  137 - 147 mEq/L   Potassium 4.8  3.7 - 5.3 mEq/L   Chloride 102  96 - 112 mEq/L   CO2 22  19 - 32 mEq/L   Glucose, Bld 106 (*) 70 - 99 mg/dL   BUN 15  6 - 23 mg/dL   Creatinine, Ser 0.68  0.50 - 1.35 mg/dL   Calcium 9.0  8.4 - 10.5 mg/dL   GFR calc non Af Amer >90  >90 mL/min   GFR   calc Af Amer >90  >90 mL/min   Comment: (NOTE)     The eGFR has been calculated using the CKD EPI equation.     This calculation has not been validated in all clinical situations.     eGFR's persistently <90 mL/min signify possible Chronic Kidney     Disease.  PROTIME-INR     Status: None   Collection Time    10/14/13  2:20 AM      Result Value Range   Prothrombin Time 14.1  11.6 - 15.2 seconds   INR 1.11  0.00 - 1.49  APTT     Status: Abnormal   Collection Time    10/14/13  2:20 AM      Result Value Range   aPTT 56 (*) 24 - 37 seconds   Comment:            IF BASELINE aPTT IS ELEVATED,     SUGGEST PATIENT RISK ASSESSMENT     BE USED TO DETERMINE APPROPRIATE     ANTICOAGULANT THERAPY.  TROPONIN I     Status: Abnormal   Collection Time    10/14/13  2:20 AM      Result Value Range   Troponin I 4.30 (*) <0.30 ng/mL    Comment:            Due to the release kinetics of cTnI,     a negative result within the first hours     of the onset of symptoms does not rule out     myocardial infarction with certainty.     If myocardial infarction is still suspected,     repeat the test at appropriate intervals.     CRITICAL RESULT CALLED TO, READ BACK BY AND VERIFIED WITH:     ABERION,S RN 10/14/2013 0507 JORDANS  HEPARIN LEVEL (UNFRACTIONATED)     Status: Abnormal   Collection Time    10/14/13  7:45 AM      Result Value Range   Heparin Unfractionated 0.18 (*) 0.30 - 0.70 IU/mL   Comment:            IF HEPARIN RESULTS ARE BELOW     EXPECTED VALUES, AND PATIENT     DOSAGE HAS BEEN CONFIRMED,     SUGGEST FOLLOW UP TESTING     OF ANTITHROMBIN III LEVELS.    Imaging: No results found.  Assessment:  Principal Problem:   NSTEMI (non-ST elevated myocardial infarction) Active Problems:   Hx of CABG   Plan:  1. Will try to obtain records into the EPIC system regarding bypass. Plan for [facility] today for NSTEMI.  He is agreeable to this and NPO. LDL is 87 this am - on lipitor 80 mg.  He has been loaded on Brillinta and aspirin.   Time Spent Directly with Patient:  15 minutes  Length of Stay:  LOS: 0 days   Pixie Casino, MD, [facility] Attending Cardiologist [facility]  Leshon Armistead C 10/14/2013, 10:33 AM

## 2013-10-15 DIAGNOSIS — E785 Hyperlipidemia, unspecified: Secondary | ICD-10-CM

## 2013-10-15 DIAGNOSIS — I10 Essential (primary) hypertension: Secondary | ICD-10-CM | POA: Diagnosis present

## 2013-10-15 DIAGNOSIS — I255 Ischemic cardiomyopathy: Secondary | ICD-10-CM | POA: Diagnosis present

## 2013-10-15 DIAGNOSIS — I25810 Atherosclerosis of coronary artery bypass graft(s) without angina pectoris: Secondary | ICD-10-CM | POA: Diagnosis present

## 2013-10-15 LAB — CBC
HCT: 39.1 % (ref 39.0–52.0)
HEMOGLOBIN: 13.6 g/dL (ref 13.0–17.0)
MCH: 32.5 pg (ref 26.0–34.0)
MCHC: 34.8 g/dL (ref 30.0–36.0)
MCV: 93.5 fL (ref 78.0–100.0)
PLATELETS: 201 10*3/uL (ref 150–400)
RBC: 4.18 MIL/uL — AB (ref 4.22–5.81)
RDW: 12.6 % (ref 11.5–15.5)
WBC: 10.7 10*3/uL — ABNORMAL HIGH (ref 4.0–10.5)

## 2013-10-15 LAB — BASIC METABOLIC PANEL
BUN: 12 mg/dL (ref 6–23)
CALCIUM: 8.8 mg/dL (ref 8.4–10.5)
CO2: 24 mEq/L (ref 19–32)
Chloride: 104 mEq/L (ref 96–112)
Creatinine, Ser: 0.88 mg/dL (ref 0.50–1.35)
GFR calc Af Amer: >90 mL/min (ref >90)
GFR calc non Af Amer: >90 mL/min (ref >90)
GLUCOSE: 116 mg/dL — AB (ref 70–99)
POTASSIUM: 4.3 meq/L (ref 3.7–5.3)
SODIUM: 142 meq/L (ref 137–147)

## 2013-10-15 MED ORDER — ISOSORBIDE MONONITRATE 15 MG HALF TABLET
15.0000 mg | ORAL_TABLET | Freq: Every day | ORAL | Status: DC
  Administered 2013-10-15: 15 mg via ORAL
  Filled 2013-10-15: qty 1

## 2013-10-15 MED ORDER — NITROGLYCERIN 0.4 MG SL SUBL: 0.4000 mg | SUBLINGUAL_TABLET | SUBLINGUAL | Status: DC | PRN

## 2013-10-15 MED ORDER — CARVEDILOL 6.25 MG PO TABS: 6.2500 mg | ORAL_TABLET | Freq: Two times a day (BID) | ORAL | Status: AC

## 2013-10-15 MED ORDER — NITROGLYCERIN 0.4 MG SL SUBL: 0.4000 mg | SUBLINGUAL_TABLET | SUBLINGUAL | Status: AC | PRN

## 2013-10-15 MED ORDER — MELOXICAM 15 MG PO TABS: 15.0000 mg | ORAL_TABLET | Freq: Every day | ORAL | Status: DC | PRN

## 2013-10-15 MED ORDER — TICAGRELOR 90 MG PO TABS: 90.0000 mg | ORAL_TABLET | Freq: Two times a day (BID) | ORAL | Status: DC

## 2013-10-15 MED ORDER — ISOSORBIDE MONONITRATE 15 MG HALF TABLET: 15.0000 mg | ORAL_TABLET | Freq: Every day | ORAL | Status: AC

## 2013-10-15 MED ORDER — ASPIRIN 81 MG PO TBEC: 81.0000 mg | DELAYED_RELEASE_TABLET | Freq: Every day | ORAL | Status: AC

## 2013-10-15 MED ORDER — LISINOPRIL 5 MG PO TABS
5.0000 mg | ORAL_TABLET | Freq: Every day | ORAL | Status: DC
  Administered 2013-10-15: 5 mg via ORAL
  Filled 2013-10-15: qty 1

## 2013-10-15 MED ORDER — ACETAMINOPHEN 325 MG PO TABS: 650.0000 mg | ORAL_TABLET | ORAL | Status: DC | PRN

## 2013-10-15 MED ORDER — LISINOPRIL 5 MG PO TABS: 5.0000 mg | ORAL_TABLET | Freq: Every day | ORAL | Status: DC

## 2013-10-15 MED ORDER — CARVEDILOL 6.25 MG PO TABS
6.2500 mg | ORAL_TABLET | Freq: Two times a day (BID) | ORAL | Status: DC
  Administered 2013-10-15: 6.25 mg via ORAL
  Filled 2013-10-15 (×3): qty 1

## 2013-10-15 MED FILL — Sodium Chloride IV Soln 0.9%: INTRAVENOUS | Qty: 50 | Status: AC

## 2013-10-15 NOTE — Progress Notes (Signed)
CARDIAC REHAB PHASE I   PRE:  Rate/Rhythm: 85 SR  BP:  Supine:   Sitting: 140/72  Standing:    SaO2: 100 RA  MODE:  Ambulation: 700 ft   POST:  Rate/Rhythm: 96 SR  BP:  Supine:   Sitting: 163/94  Standing:    SaO2: 94 RA Patient tolerated ambulation well without angina or distress.  Education completed re: MI, activity restrictions, angina symptoms, NTG usage, when to call 911, and the doctor.  Patient is still smoking cigars daily, diet is poor, and he is not exercising.  He consented to being referred to [place] which I feel he needs to help him with his lifestyle changes.  He says he can get a sitter to watch his wife so as to attend.   [phone] Jerry Channel RN, BSN 10/15/2013 12:16 PM

## 2013-10-15 NOTE — Progress Notes (Signed)
DAILY PROGRESS NOTE  Subjective:  No complaints today - denies chest pain.  Wants to go home- he is the primary caregiver for his wife.  Left heart cath yesterday revealed the following:  Moderate LV dysfunction with severe hypo-kinesis involving the basal to mid inferior wall with a ejection fraction of approximately 40%.  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  Patent left internal mammary artery graft supplying the mid LAD  Patent saphenous vein graft supplying the diagonal vessel  Patent saphenous vein graft supplying the circumflex marginal vessel  Recently occluded saphenous vein graft supplying the distal RCA.   Successful percutaneous coronary intervention to the vein graft supplying the RCA with PTCA/DES stenting with a 3.0x33 mm Xience Alpine DES stent postdilated 3.25 mm with distal 100% occlusion and TIMI 0 flow being reduced to 0% and TIMI 3 flow, and evidence for diffuse disease in the proximal to mid segment with areas of ectasia and narrowing of 60% in the mid vessel which was not intervened upon.  Objective:  Temp:  [97.3 F (36.3 C)-98.9 F (37.2 C)] 97.3 F (36.3 C) (01/30 0800) Pulse Rate:  [61-91] 87 (01/30 0820) Resp:  [14-25] 19 (01/30 0820) BP: (107-151)/(54-96) 144/76 mmHg (01/30 0820) SpO2:  [89 %-99 %] 93 % (01/30 0820) Arterial Line BP: (134-158)/(71-137) 134/74 mmHg (01/29 1930) Weight change:   Intake/Output from previous day: 01/29 0701 - 01/30 0700 In: 2112.5 [I.V.:2112.5] Out: 1425 [Urine:1425]  Intake/Output from this shift: Total  I/O In: 109 [I.V.:109] Out: 150 [Urine:150]  Medications: Current Facility-Administered Medications  Medication Dose Route Frequency Provider Last Rate Last Dose  . 0.9 %  sodium chloride infusion   Intravenous Continuous Troy Sine, MD 100 mL/hr at 10/15/13 0700    . acetaminophen (TYLENOL) tablet 650 mg  650 mg Oral Q4H PRN Stephani Police, MD   650 mg at 10/14/13 0127  . aspirin EC tablet 81 mg  81 mg Oral Daily Stephani Police, MD   81 mg at 10/15/13 0944  . atorvastatin (LIPITOR) tablet 80 mg  80 mg Oral q1800 Stephani Police, MD   80 mg at 10/14/13 1804  . bivalirudin (ANGIOMAX) 5 mg/mL in sodium chloride 0.9 % 50 mL infusion  0.25 mg/kg/hr Intravenous Continuous Candee Furbish, MD   0.25 mg/kg/hr at 10/14/13 1512  . levothyroxine (SYNTHROID, LEVOTHROID) tablet 150 mcg  150 mcg Oral QAC breakfast Stephani Police, MD   150 mcg at 10/15/13 0754  . nitroGLYCERIN 0.2 mg/mL in dextrose 5 % infusion  2-200 mcg/min Intravenous Titrated Stephani Police, MD 6 mL/hr at 10/15/13 0945 20 mcg/min at 10/15/13 0945  . ondansetron (ZOFRAN) injection 4 mg  4 mg Intravenous Q6H PRN Stephani Police, MD      . Ticagrelor [facility]) tablet 90 mg  90 mg Oral BID Stephani Police, MD   90 mg at 10/15/13 0944    Physical Exam: General appearance: alert and no distress Neck: no carotid bruit and no JVD Lungs: clear to auscultation bilaterally Heart: regular rate and rhythm, S1, S2 normal, no murmur, click, rub or gallop Abdomen: soft, non-tender; bowel sounds normal; no masses,  no organomegaly Extremities:  extremities normal, atraumatic, no cyanosis or edema Pulses: 2+ and symmetric Skin: Skin color, texture, turgor normal. No rashes or lesions Neurologic: Grossly normal Psych: Pleasant  Lab Results: Results for orders placed during the hospital encounter of 10/14/13 (from the past 48 hour(s))  MRSA PCR SCREENING     Status: None   Collection Time    10/14/13 12:18 AM      Result Value Range   MRSA by PCR NEGATIVE   NEGATIVE   Comment:            The GeneXpert MRSA Assay (FDA     approved for NASAL specimens     only), is one component of a     comprehensive MRSA colonization     surveillance program. It is not     intended to diagnose MRSA     infection nor to guide or     monitor treatment for     MRSA infections.  LIPID PANEL     Status: Abnormal   Collection Time    10/14/13  2:20 AM      Result Value Range   Cholesterol 145  0 - 200 mg/dL   Triglycerides 407  <680 mg/dL   HDL 35 (*) >88 mg/dL   Total CHOL/HDL Ratio 4.1     VLDL 23  0 - 40 mg/dL   LDL Cholesterol 87  0 - 99 mg/dL   Comment:            Total Cholesterol/HDL:CHD Risk     Coronary Heart Disease Risk Table                         Men   Women      1/2 Average Risk   3.4   3.3      Average Risk       5.0   4.4      2 X Average Risk   9.6   7.1      3 X Average Risk  23.4   11.0                Use the calculated Patient Ratio     above and the CHD Risk Table     to determine the patient's CHD Risk.                ATP III CLASSIFICATION (LDL):      <100     mg/dL   Optimal      110-315  mg/dL   Near or Above                        Optimal      130-159  mg/dL   Borderline      945-859  mg/dL   High      >292     mg/dL   Very High  CBC     Status: Abnormal   Collection Time    10/14/13  2:20 AM      Result Value Range   WBC 12.1 (*) 4.0 - 10.5 K/uL   RBC 4.37  4.22 - 5.81 MIL/uL   Hemoglobin 14.4  13.0 - 17.0 g/dL   HCT 44.6  28.6 - 38.1 %   MCV 93.6  78.0 - 100.0 fL   MCH 33.0  26.0 - 34.0 pg   MCHC 35.2  30.0 - 36.0 g/dL   RDW 77.1  16.5 -  15.5 %   Platelets 202  150 - 400 K/uL  BASIC METABOLIC PANEL     Status: Abnormal   Collection Time    10/14/13  2:20 AM      Result Value Range   Sodium 139  137 - 147 mEq/L   Potassium 4.8  3.7 - 5.3 mEq/L   Chloride 102  96 - 112 mEq/L   CO2 22  19 - 32 mEq/L   Glucose, Bld 106 (*) 70 - 99 mg/dL   BUN 15  6 - 23 mg/dL   Creatinine, Ser 0.68  0.50 - 1.35 mg/dL    Calcium 9.0  8.4 - 10.5 mg/dL   GFR calc non Af Amer >90  >90 mL/min   GFR calc Af Amer >90  >90 mL/min   Comment: (NOTE)     The eGFR has been calculated using the CKD EPI equation.     This calculation has not been validated in all clinical situations.     eGFR's persistently <90 mL/min signify possible Chronic Kidney     Disease.  PROTIME-INR     Status: None   Collection Time    10/14/13  2:20 AM      Result Value Range   Prothrombin Time 14.1  11.6 - 15.2 seconds   INR 1.11  0.00 - 1.49  APTT     Status: Abnormal   Collection Time    10/14/13  2:20 AM      Result Value Range   aPTT 56 (*) 24 - 37 seconds   Comment:            IF BASELINE aPTT IS ELEVATED,     SUGGEST PATIENT RISK ASSESSMENT     BE USED TO DETERMINE APPROPRIATE     ANTICOAGULANT THERAPY.  TROPONIN I     Status: Abnormal   Collection Time    10/14/13  2:20 AM      Result Value Range   Troponin I 4.30 (*) <0.30 ng/mL   Comment:            Due to the release kinetics of cTnI,     a negative result within the first hours     of the onset of symptoms does not rule out     myocardial infarction with certainty.     If myocardial infarction is still suspected,     repeat the test at appropriate intervals.     CRITICAL RESULT CALLED TO, READ BACK BY AND VERIFIED WITH:     ABERION,S RN 10/14/2013 0507 JORDANS  HEMOGLOBIN A1C     Status: Abnormal   Collection Time    10/14/13  2:20 AM      Result Value Range   Hemoglobin A1C 6.0 (*) <5.7 %   Comment: (NOTE)                                                                               According to the ADA Clinical Practice Recommendations for 2011, when     HbA1c is used as a screening test:      >=6.5%   Diagnostic of Diabetes Mellitus               (  if abnormal result is confirmed)     5.7-6.4%   Increased risk of developing Diabetes Mellitus     References:Diagnosis and Classification of Diabetes Mellitus,Diabetes     BMWU,1324,40(NUUVO 1):S62-S69 and  Standards of Medical Care in             Diabetes - 2011,Diabetes ZDGU,4403,47 (Suppl 1):S11-S61.   Mean Plasma Glucose 126 (*) <117 mg/dL   Comment: Performed at [facility]  TSH     Status: None   Collection Time    10/14/13  2:20 AM      Result Value Range   TSH 0.755  0.350 - 4.500 uIU/mL   Comment: Performed at [facility] (UNFRACTIONATED)     Status: Abnormal   Collection Time    10/14/13  7:45 AM      Result Value Range   Heparin Unfractionated 0.18 (*) 0.30 - 0.70 IU/mL   Comment:            IF HEPARIN RESULTS ARE BELOW     EXPECTED VALUES, AND PATIENT     DOSAGE HAS BEEN CONFIRMED,     SUGGEST FOLLOW UP TESTING     OF ANTITHROMBIN III LEVELS.  POCT ACTIVATED CLOTTING TIME     Status: None   Collection Time    10/14/13  3:17 PM      Result Value Range   Activated Clotting Time 343    POCT ACTIVATED CLOTTING TIME     Status: None   Collection Time    10/14/13  8:25 PM      Result Value Range   Activated Clotting Time 160    CBC     Status: Abnormal   Collection Time    10/15/13  2:25 AM      Result Value Range   WBC 10.7 (*) 4.0 - 10.5 K/uL   RBC 4.18 (*) 4.22 - 5.81 MIL/uL   Hemoglobin 13.6  13.0 - 17.0 g/dL   HCT 39.1  39.0 - 52.0 %   MCV 93.5  78.0 - 100.0 fL   MCH 32.5  26.0 - 34.0 pg   MCHC 34.8  30.0 - 36.0 g/dL   RDW 12.6  11.5 - 15.5 %   Platelets 201  150 - 400 K/uL  BASIC METABOLIC PANEL     Status: Abnormal   Collection Time    10/15/13  2:25 AM      Result Value Range   Sodium 142  137 - 147 mEq/L   Potassium 4.3  3.7 - 5.3 mEq/L   Chloride 104  96 - 112 mEq/L   CO2 24  19 - 32 mEq/L   Glucose, Bld 116 (*) 70 - 99 mg/dL   BUN 12  6 - 23 mg/dL   Creatinine, Ser 0.88  0.50 - 1.35 mg/dL   Calcium 8.8  8.4 - 10.5 mg/dL   GFR calc non Af Amer >90  >90 mL/min   GFR calc Af Amer >90  >90 mL/min   Comment: (NOTE)     The eGFR has been calculated using the CKD EPI equation.     This calculation has not been  validated in all clinical situations.     eGFR's persistently <90 mL/min signify possible Chronic Kidney     Disease.    Imaging: No results found.  Assessment:  Principal Problem:   NSTEMI (non-ST elevated myocardial infarction) Active Problems:   Hx of CABG   CAD (coronary artery disease)  of artery bypass graft   Dyslipidemia   Plan:  Wean off nitroglycerin today. Start imdur 30 mg daily. Continue atorvastatin 80 mg daily at home. Restart coreg at lower dose of 6.25 mg BID.  Add low dose lisinopril 5 mg daily.  Ambulate with cardiac rehab this morning. [place] for discharge today - will need follow-up.  I'm happy to see him in the [place] office.   Time Spent Directly with Patient:  15 minutes  Length of Stay:  LOS: 1 day   Pixie Casino, MD, [facility] Attending Cardiologist [facility]  Jazzalyn Loewenstein C 10/15/2013, 10:14 AM

## 2013-10-15 NOTE — Discharge Summary (Signed)
Patient ID: Jerry Cardenas,  MRN: [id number], DOB/AGE: 12/15/1950 63 y.o.  Admit date: 10/14/2013 Discharge date: 10/15/2013  Primary Care Provider: Dr Celedonio Savage (in [place]) Primary Cardiologist: Dr Debara Pickett  Discharge Diagnoses Principal Problem:   NSTEMI (non-ST elevated myocardial infarction) Active Problems:   CAD CABG '05- SVG-RCA DES 10/14/13   Dyslipidemia   HTN (hypertension)   Cardiomyopathy, ischemic EF 40%    Procedures: Cath/ PCI 10/14/13   Hospital Course:  Jerry Cardenas is a 63 year old male previously followed by Dr.Weintraub. The patient underwent CABG revascularization surgery in 2005 by Dr. Ricard Dillon and had a LIMA placed to his LAD, and SVG to diagonal, and SVG to the circumflex marginal vessel, and an SVG to his distal right coronary artery. He had not seen Dr. Rollene Fare in several years. He presented to [facility] 10/14/13 with 3 days of stuttering chest pain and diffuse less than 1 mm ST segment depression. His Troponin was positive 4.30. He had a cath 10/14/13. This revealed a patent left internal mammary artery graft supplying the mid LAD, a patent saphenous vein graft supplying the diagonal vessel, a patent saphenous vein graft supplying the circumflex marginal vessel and a recently occluded saphenous vein graft supplying the distal RCA. He had a DES placed to the [place]. He did have moderate LVD with an EF of 40%. He was seen the morning of the 30th and felt stable for discharge.     Discharge Vitals:  Blood pressure 112/63, pulse 79, temperature 97.3 F (36.3 C), temperature source Oral, resp. rate 23, height 6' (1.829 m), weight 236 lb 8.9 oz (107.3 kg), SpO2 94.00%.    Labs: Results for orders placed during the hospital encounter of 10/14/13 (from the past 48 hour(s))  MRSA PCR SCREENING     Status: None   Collection Time    10/14/13 12:18 AM      Result Value Range   MRSA by PCR NEGATIVE  NEGATIVE   Comment:            The GeneXpert MRSA Assay  (FDA     approved for NASAL specimens     only), is one component of a     comprehensive MRSA colonization     surveillance program. It is not     intended to diagnose MRSA     infection nor to guide or     monitor treatment for     MRSA infections.  LIPID PANEL     Status: Abnormal   Collection Time    10/14/13  2:20 AM      Result Value Range   Cholesterol 145  0 - 200 mg/dL   Triglycerides 114  <150 mg/dL   HDL 35 (*) >39 mg/dL   Total CHOL/HDL Ratio 4.1     VLDL 23  0 - 40 mg/dL   LDL Cholesterol 87  0 - 99 mg/dL   Comment:            Total Cholesterol/HDL:CHD Risk     Coronary Heart Disease Risk Table                         Men   Women      1/2 Average Risk   3.4   3.3      Average Risk       5.0   4.4      2 X Average Risk   9.6   7.1  3 X Average Risk  23.4   11.0                Use the calculated Patient Ratio     above and the CHD Risk Table     to determine the patient's CHD Risk.                ATP III CLASSIFICATION (LDL):      <100     mg/dL   Optimal      100-129  mg/dL   Near or Above                        Optimal      130-159  mg/dL   Borderline      160-189  mg/dL   High      >190     mg/dL   Very High  CBC     Status: Abnormal   Collection Time    10/14/13  2:20 AM      Result Value Range   WBC 12.1 (*) 4.0 - 10.5 K/uL   RBC 4.37  4.22 - 5.81 MIL/uL   Hemoglobin 14.4  13.0 - 17.0 g/dL   HCT 40.9  39.0 - 52.0 %   MCV 93.6  78.0 - 100.0 fL   MCH 33.0  26.0 - 34.0 pg   MCHC 35.2  30.0 - 36.0 g/dL   RDW 12.6  11.5 - 15.5 %   Platelets 202  150 - 400 K/uL  BASIC METABOLIC PANEL     Status: Abnormal   Collection Time    10/14/13  2:20 AM      Result Value Range   Sodium 139  137 - 147 mEq/L   Potassium 4.8  3.7 - 5.3 mEq/L   Chloride 102  96 - 112 mEq/L   CO2 22  19 - 32 mEq/L   Glucose, Bld 106 (*) 70 - 99 mg/dL   BUN 15  6 - 23 mg/dL   Creatinine, Ser 0.68  0.50 - 1.35 mg/dL   Calcium 9.0  8.4 - 10.5 mg/dL   GFR calc non Af Amer >90   >90 mL/min   GFR calc Af Amer >90  >90 mL/min   Comment: (NOTE)     The eGFR has been calculated using the CKD EPI equation.     This calculation has not been validated in all clinical situations.     eGFR's persistently <90 mL/min signify possible Chronic Kidney     Disease.  PROTIME-INR     Status: None   Collection Time    10/14/13  2:20 AM      Result Value Range   Prothrombin Time 14.1  11.6 - 15.2 seconds   INR 1.11  0.00 - 1.49  APTT     Status: Abnormal   Collection Time    10/14/13  2:20 AM      Result Value Range   aPTT 56 (*) 24 - 37 seconds   Comment:            IF BASELINE aPTT IS ELEVATED,     SUGGEST PATIENT RISK ASSESSMENT     BE USED TO DETERMINE APPROPRIATE     ANTICOAGULANT THERAPY.  TROPONIN I     Status: Abnormal   Collection Time    10/14/13  2:20 AM      Result Value Range   Troponin I 4.30 (*) <0.30 ng/mL   Comment:  Due to the release kinetics of cTnI,     a negative result within the first hours     of the onset of symptoms does not rule out     myocardial infarction with certainty.     If myocardial infarction is still suspected,     repeat the test at appropriate intervals.     CRITICAL RESULT CALLED TO, READ BACK BY AND VERIFIED WITH:     ABERION,S RN 10/14/2013 0507 JORDANS  HEMOGLOBIN A1C     Status: Abnormal   Collection Time    10/14/13  2:20 AM      Result Value Range   Hemoglobin A1C 6.0 (*) <5.7 %   Comment: (NOTE)                                                                               According to the ADA Clinical Practice Recommendations for 2011, when     HbA1c is used as a screening test:      >=6.5%   Diagnostic of Diabetes Mellitus               (if abnormal result is confirmed)     5.7-6.4%   Increased risk of developing Diabetes Mellitus     References:Diagnosis and Classification of Diabetes Mellitus,Diabetes     Care,2011,34(Suppl 1):S62-S69 and Standards of Medical Care in             Diabetes -  2011,Diabetes Care,2011,34 (Suppl 1):S11-S61.   Mean Plasma Glucose 126 (*) <117 mg/dL   Comment: Performed at [facility]  TSH     Status: None   Collection Time    10/14/13  2:20 AM      Result Value Range   TSH 0.755  0.350 - 4.500 uIU/mL   Comment: Performed at [facility]  HEPARIN LEVEL (UNFRACTIONATED)     Status: Abnormal   Collection Time    10/14/13  7:45 AM      Result Value Range   Heparin Unfractionated 0.18 (*) 0.30 - 0.70 IU/mL   Comment:            IF HEPARIN RESULTS ARE BELOW     EXPECTED VALUES, AND PATIENT     DOSAGE HAS BEEN CONFIRMED,     SUGGEST FOLLOW UP TESTING     OF ANTITHROMBIN III LEVELS.  POCT ACTIVATED CLOTTING TIME     Status: None   Collection Time    10/14/13  3:17 PM      Result Value Range   Activated Clotting Time 343    POCT ACTIVATED CLOTTING TIME     Status: None   Collection Time    10/14/13  8:25 PM      Result Value Range   Activated Clotting Time 160    CBC     Status: Abnormal   Collection Time    10/15/13  2:25 AM      Result Value Range   WBC 10.7 (*) 4.0 - 10.5 K/uL   RBC 4.18 (*) 4.22 - 5.81 MIL/uL   Hemoglobin 13.6  13.0 - 17.0 g/dL   HCT 73.0  28.8 - 55.6 %   MCV 93.5  78.0 - 100.0 fL   MCH 32.5  26.0 - 34.0 pg   MCHC 34.8  30.0 - 36.0 g/dL   RDW 12.6  11.5 - 15.5 %   Platelets 201  150 - 400 K/uL  BASIC METABOLIC PANEL     Status: Abnormal   Collection Time    10/15/13  2:25 AM      Result Value Range   Sodium 142  137 - 147 mEq/L   Potassium 4.3  3.7 - 5.3 mEq/L   Chloride 104  96 - 112 mEq/L   CO2 24  19 - 32 mEq/L   Glucose, Bld 116 (*) 70 - 99 mg/dL   BUN 12  6 - 23 mg/dL   Creatinine, Ser 0.88  0.50 - 1.35 mg/dL   Calcium 8.8  8.4 - 10.5 mg/dL   GFR calc non Af Amer >90  >90 mL/min   GFR calc Af Amer >90  >90 mL/min   Comment: (NOTE)     The eGFR has been calculated using the CKD EPI equation.     This calculation has not been validated in all clinical situations.     eGFR's  persistently <90 mL/min signify possible Chronic Kidney     Disease.    Disposition:      Follow-up Information   Follow up with Pixie Casino, MD. (office will call you)    Specialty:  Cardiology   Contact information:   [place] [address] [phone]       Discharge Medications:    Medication List         acetaminophen 325 MG tablet  Commonly known as:  TYLENOL  Take 2 tablets (650 mg total) by mouth every 4 (four) hours as needed for headache or mild pain.     aspirin 81 MG EC tablet  Take 1 tablet (81 mg total) by mouth daily.     atorvastatin 80 MG tablet  Commonly known as:  LIPITOR  Take 80 mg by mouth daily.     carvedilol 6.25 MG tablet  Commonly known as:  COREG  Take 1 tablet (6.25 mg total) by mouth 2 (two) times daily with a meal.     isosorbide mononitrate 15 mg Tb24 24 hr tablet  Commonly known as:  IMDUR  Take 0.5 tablets (15 mg total) by mouth daily.     levothyroxine 150 MCG tablet  Commonly known as:  SYNTHROID, LEVOTHROID  Take 150 mcg by mouth daily before breakfast.     lisinopril 5 MG tablet  Commonly known as:  PRINIVIL,ZESTRIL  Take 1 tablet (5 mg total) by mouth daily.     meloxicam 15 MG tablet  Commonly known as:  MOBIC  Take 1 tablet (15 mg total) by mouth daily as needed for pain.     nitroGLYCERIN 0.4 MG SL tablet  Commonly known as:  NITROSTAT  Place 1 tablet (0.4 mg total) under the tongue every 5 (five) minutes as needed for chest pain.     Ticagrelor 90 MG Tabs tablet  Commonly known as:  BRILINTA  Take 1 tablet (90 mg total) by mouth 2 (two) times daily.         Duration of Discharge Encounter: Greater than 30 minutes including physician time.  Angelena Form PA-C 10/15/2013 11:34 AM

## 2013-10-15 NOTE — Discharge Instructions (Signed)
Coronary Angiography with Stent Coronary angiography with stent placement is a procedure to widen or open a narrow blood vessel of the heart (coronary artery). When a coronary artery becomes partially blocked, it decreases blood flow to that area. This may lead to chest pain or a heart attack (myocardial infarction). Arteries may become blocked by cholesterol buildup (plaque) in the lining or wall.  A stent is a small piece of metal that looks like a mesh or a spring. Stent placement may be done right after a coronary angiography in which a blocked artery is found or as a treatment for a heart attack.  LET [facility] CARE PROVIDER KNOW ABOUT:  Any allergies you have.   All medicines you are taking, including vitamins, herbs, eye drops, creams, and over-the-counter medicines.   Previous problems you or members of your family have had with the use of anesthetics.   Any blood disorders you have.   Previous surgeries you have had.   Medical conditions you have. RISKS AND COMPLICATIONS Generally, coronary angiography with stent is a safe procedure. However, as with any procedure, complications can occur. Possible complications include:   Damage to the heart or its blood vessels.   A return of blockage.   Bleeding at the site.   Blood clot in another part of the body.   Kidney injury.   Allergic reaction to the dye or contrast used.  BEFORE THE PROCEDURE  Do not eat or drink anything for 6 hours before the procedure.   Ask your health care provider if medicines can be taken with a sip of water.   Your health care provider will make sure you understand the procedure and the risks and potential complications associated with the procedure.  PROCEDURE  You may be given a medicine to help you relax before and during the procedure (sedative). This medicine will be given through an IV tube that is put into one of your veins.   The area where the catheter will be inserted  is shaved and cleaned. This is usually done in the groin but may be done in the fold of your arm (near your elbow) or in the wrist.   A medicine will be given to numb the area where the catheter will be inserted (local anesthetic).   The catheter is inserted into an artery using a guide wire. A type of X-ray (fluoroscopy) is used to help guide the catheter to the opening of the blocked artery.   A dye is then injected into the catheter, and X-rays are taken. The dye helps to show where any narrowing or blockages are located in the heart arteries.   A tiny wire is guided to the blocked spot, and a balloon is inflated to make the artery wider. The stent is expanded and crushes the plaque into the wall of the vessel. The stent holds the area open like a scaffolding and improves the blood flow.   Sometimes the artery may be made wider using a laser or other tools to remove plaque.   When the blood flow is better, the catheter is removed. The lining of the artery will grow over the stent, which stays where it was placed.  AFTER THE PROCEDURE  If the procedure is done through the leg, you will be kept in bed lying flat for about 6 hours. You will be instructed to not bend or cross your legs.   The insertion site will be checked frequently.   The pulse in your  feet or wrist will be checked frequently.   Additional blood tests, X-rays, and electrocardiography may be done. Document Released: 03/09/2003 Document Revised: 06/23/2013 Document Reviewed: 03/11/2013 [facility] Patient Information 2014 [place]. Myocardial Infarction A myocardial infarction (MI) is damage to the heart that is not reversible. It is also called a heart attack. An MI usually occurs when a heart (coronary) artery becomes blocked or narrowed. This cuts off the blood supply to the heart. When one or more of the heart (coronary) arteries becomes blocked, that area of the heart begins to die. This causes pain felt  during an MI.  If you think you might be having an MI, call your local emergency services immediately (911 in [place] It is recommended that you take a 162 mg non-enteric coated aspirin if you do not have an aspirin allergy. Do not drive yourself to the hospital or wait to see if your symptoms go away. The sooner MI is treated, the greater the amount of heart muscle saved. Time is muscle. It can save your life. CAUSES  An MI can occur from:  A gradual buildup of a fatty substance called plaque. When plaque builds up in the arteries, this condition is called atherosclerosis. This buildup can block or reduce the blood supply to the heart artery(s).  A sudden plaque rupture within a heart artery that causes a blood clot (thrombus). A blood clot can block the heart artery which does not allow blood flow to the heart.  A severe tightening (spasm) of the heart artery. This is a less common cause of a heart attack. When a heart artery spasms, it cuts off blood flow through the artery. Spasms can occur in heart arteries that do not have atherosclerosis. RISK FACTORS People at risk for an MI usually have one or more risk factors, such as:  High blood pressure.  High cholesterol.  Smoking.  Gender. Men have a higher heart attack risk.  Overweight/obesity.  Age.  Family history.  Lack of exercise.  Diabetes.  Stress.  Excessive alcohol use.  Street drug use (cocaine and methamphetamines). SYMPTOMS  MI symptoms can vary, such as:  In both men and women, MI symptoms can include the following:  Chest pain. The chest pain may feel like a crushing, squeezing, or "pressure" type feeling. MI pain can be "referred," meaning pain can be caused in one part of the body but felt in another part of the body. Referred MI pain may occur in the left arm, neck, or jaw. Pain may even be felt in the right arm.  Shortness of breath (dyspnea).  Heartburn or indigestion with or without vomiting, shortness  of breath, or sweating (diaphoresis).  Sudden, cold sweats.  Sudden lightheadedness.  Upper back pain.  Women can have unique MI symptoms, such as:  Unexplained feelings of nervousness or anxiety.  Discomfort between the shoulder blades (scapula) or upper back.  Tingling in the hands and arms.  In elderly people (regardless of gender), MI symptoms can be subtle, such as:  Sweating (diaphoresis).  Shortness of breath (dyspnea).  General tiredness (fatigue) or not feeling well (malaise). DIAGNOSIS  Diagnosis of an MI involves several tests such as:  An assessment of your vital signs such as heart rhythm, blood pressure, respiratory rate, and oxygen level.  An EKG (ECG) to look at the electrical activity of your heart.  Blood tests called cardiac markers are drawn at scheduled times to measure proteins or enzymes released by the damaged heart muscle.  A  chest X-ray.  An echocardiogram to evaluate heart motion and blood flow.  Coronary angiography (cardiac catheterization). This is a diagnostic procedure to look at the heart arteries. TREATMENT  Acute Intervention. For an MI, the national standard in the [place] is to have an acute intervention in under 90 minutes from the time you get to the hospital. An acute intervention is a special procedure to open up the heart arteries. It is done in a treatment room called a "catheterization lab" (cath lab). Some hospitals do no have a cath lab. If you are having an MI and the hospital does not have a cath lab, the standard is to transport you to a hospital that has one. In the cath lab, acute intervention includes:  Angioplasty. An angioplasty involves inserting a thin, flexible tube (catheter) into an artery in either your groin or wrist. The catheter is threaded to the heart arteries. A balloon at the end of the catheter is inflated to open a narrowed or blocked heart artery. During an angioplasty procedure, a small mesh tube  (stent) may be used to keep the heart artery open. Depending on your condition and health history, one of two types of stents may be placed:  Drug-eluting stent (DES). A DES is coated with a medicine to prevent scar tissue from growing over the stent. With drug-eluting stents, blood thinning medicine will need to be taken for up to a year.  Bare metal stent. This type of stent has no special coating to keep tissue from growing over it. This type of stent is used if you cannot take blood thinning medicine for a prolonged time or you need surgery in the near future. After a bare metal stent is placed, blood thinning medicine will need to be taken for about a month.  If you are taking blood thinning medicine (anti-platelet therapy) after stent placement, do not stop taking it unless your caregiver says it is okay to do so. Make sure you understand how long you need to take the medicine. Surgical Intervention  If an acute intervention is not successful, surgery may be needed:  Open heart surgery (coronary artery bypass graft, CABG). CABG takes a vein (saphenous vein) from your leg. The vein is then attached to the blocked heart artery which bypasses the blockage. This then allows blood flow to the heart muscle. Additional Interventions  A "clot buster" medicine (thrombolytic) may be given. This medicine can help break up a clot in the heart artery. This medicine may be given if a person cannot get to a cath lab right away.  Intra-aortic balloon pump (IABP). If you have suffered a very severe MI and are too unstable to go to the cath lab or to surgery, an IABP may be used. This is a temporary mechanical device used to increase blood flow to the heart and reduce the workload of the heart until you are stable enough to go to the cath lab or surgery. HOME CARE INSTRUCTIONS After an MI, you may need the following:  Medicine. Take medicine as directed by your caregiver. Medicines after an MI may:  Keep  your blood from clotting easily (blood thinners).  Control your blood pressure.  Help lower your cholesterol.  Control abnormal heart rhythms.  Lifestyle changes. Under the guidance of your caregiver, lifestyle changes include:  Quitting smoking, if you smoke. Your caregiver can help you quit.  Being physically active.  Maintaining a healthy weight.  Eating a heart healthy diet. A dietitian can help  you learn healthy eating options.  Managing diabetes.  Reducing stress.  Limiting alcohol intake. SEEK IMMEDIATE MEDICAL CARE IF:   You have severe chest pain, especially if the pain is crushing or pressure-like and spreads to the arms, back, neck, or jaw. This is an emergency. Do not wait to see if the pain will go away. Get medical help at once. Call your local emergency services (911 in the [place] Do not drive yourself to the hospital.  You have shortness of breath during rest, sleep, or with activity.  You have sudden sweating or clammy skin.  You feel sick to your stomach (nauseous) and throw up (vomit).  You suddenly become lightheaded or dizzy.  You feel your heart beating rapidly or you notice "skipped" beats. MAKE SURE YOU:   Understand these instructions.  Will watch your condition.  Will get help right away if you are not doing well or get worse. Document Released: 09/02/2005 Document Revised: 05/27/2012 Document Reviewed: 02/05/2011 [facility] Patient Information 2014 [place], [place].

## 2013-10-15 NOTE — Progress Notes (Signed)
D/C instructions and prescription given.  IV d/c.  Groin site care instructions given.  Pt d/c home per orders.

## 2013-10-28 ENCOUNTER — Encounter: Payer: Self-pay | Admitting: *Deleted

## 2013-10-29 ENCOUNTER — Ambulatory Visit (INDEPENDENT_AMBULATORY_CARE_PROVIDER_SITE_OTHER): Payer: Medicare HMO | Admitting: Internal Medicine

## 2013-10-29 ENCOUNTER — Encounter: Payer: Self-pay | Admitting: Internal Medicine

## 2013-10-29 VITALS — BP 132/80 | HR 80 | Ht 72.0 in | Wt 238.8 lb

## 2013-10-29 DIAGNOSIS — I214 Non-ST elevation (NSTEMI) myocardial infarction: Secondary | ICD-10-CM

## 2013-10-29 DIAGNOSIS — I2510 Atherosclerotic heart disease of native coronary artery without angina pectoris: Secondary | ICD-10-CM

## 2013-10-29 DIAGNOSIS — I10 Essential (primary) hypertension: Secondary | ICD-10-CM

## 2013-10-29 DIAGNOSIS — E785 Hyperlipidemia, unspecified: Secondary | ICD-10-CM

## 2013-10-29 DIAGNOSIS — I255 Ischemic cardiomyopathy: Secondary | ICD-10-CM

## 2013-10-29 DIAGNOSIS — I25810 Atherosclerosis of coronary artery bypass graft(s) without angina pectoris: Secondary | ICD-10-CM

## 2013-10-29 DIAGNOSIS — I2589 Other forms of chronic ischemic heart disease: Secondary | ICD-10-CM

## 2013-10-29 NOTE — Progress Notes (Signed)
OFFICE NOTE  Chief Complaint:  No complaints  Primary Care Physician: Celedonio Savage, MD  HPI:  Jerry Cardenas is a 63 year old male previously followed by Dr.Weintraub. The patient underwent CABG revascularization surgery in 2005 by Dr. Ricard Dillon and had a LIMA placed to his LAD, and SVG to diagonal, and SVG to the circumflex marginal vessel, and an SVG to his distal right coronary artery. He had not seen Dr. Rollene Fare in several years. He presented to [facility] 10/14/13 with 3 days of stuttering chest pain and diffuse less than 1 mm ST segment depression. His Troponin was positive 4.30. He had a cath 10/14/13. This revealed a patent left internal mammary artery graft supplying the mid LAD, a patent saphenous vein graft supplying the diagonal vessel, a patent saphenous vein graft supplying the circumflex marginal vessel and a recently occluded saphenous vein graft supplying the distal RCA. He had a DES placed to the [place]. He did have moderate LVD with an EF of 40%.   He returns for hospital followup today and is feeling quite well. His only complaint is about taking Brillinta, which he says is too expensive. He is also concerned about the distance that he has to drive from [place] to come to this office and is requesting to see another provider in our [place] office. He is not arranged cardiac rehabilitation.  PMHx:  Past Medical History  Diagnosis Date  . Arthritis   . Hypertension   . Coronary artery disease     h/o CABG  . Myocardial infarction   . Hypothyroidism   . Anginal pain   . History of tobacco abuse     Past Surgical History  Procedure Laterality Date  . Coronary artery bypass graft  07/17/2004    CABGx4 - LIMA to distal LAD, SVG to 1st diagonal, SVG to Cfx marginal, SVG to distal RCA (Dr. Remus Loffler)  . Cardiac catheterization  07/12/2004    LAD 70% narrowing, 95% mildly segmental stenosis of LAD beyond second diagonal, 95% stenosis of large prox inferior bifurcation  branch of diagonal, Cfx had 60-70% stenosis, 50% irregularity and narrowing allong the prox band and another 60% narrowing of mid-portion, RV branch from prox 3rd with 95% prox stenosis (Dr. Marella Chimes) - subsequent CABG  . Back surgery      FAMHx:  No family history on file.  SOCHx:   reports that he has been smoking Cigars.  He has quit using smokeless tobacco. He reports that he does not drink alcohol or use illicit drugs.  ALLERGIES:  Allergies  Allergen Reactions  . Penicillins     Swelling in extremeties    ROS: A comprehensive review of systems was negative.  HOME MEDS: Current Outpatient Prescriptions  Medication Sig Dispense Refill  . acetaminophen (TYLENOL) 325 MG tablet Take 2 tablets (650 mg total) by mouth every 4 (four) hours as needed for headache or mild pain.      Marland Kitchen aspirin EC 81 MG EC tablet Take 1 tablet (81 mg total) by mouth daily.      Marland Kitchen atorvastatin (LIPITOR) 80 MG tablet Take 80 mg by mouth daily.      . carvedilol (COREG) 6.25 MG tablet Take 1 tablet (6.25 mg total) by mouth 2 (two) times daily with a meal.  60 tablet  11  . isosorbide mononitrate (IMDUR) 15 mg TB24 24 hr tablet Take 0.5 tablets (15 mg total) by mouth daily.  45 tablet  3  . levothyroxine (SYNTHROID, LEVOTHROID)  150 MCG tablet Take 150 mcg by mouth daily before breakfast.      . lisinopril (PRINIVIL,ZESTRIL) 5 MG tablet Take 1 tablet (5 mg total) by mouth daily.  90 tablet  3  . meloxicam (MOBIC) 15 MG tablet Take 1 tablet (15 mg total) by mouth daily as needed for pain.      . nitroGLYCERIN (NITROSTAT) 0.4 MG SL tablet Place 1 tablet (0.4 mg total) under the tongue every 5 (five) minutes as needed for chest pain.  25 tablet  2  . Ticagrelor (BRILINTA) 90 MG TABS tablet Take 1 tablet (90 mg total) by mouth 2 (two) times daily.  60 tablet  11   No current facility-administered medications for this visit.    LABS/IMAGING: No results found for this or any previous visit (from the past 48  hour(s)). No results found.  VITALS: BP 132/80  Pulse 80  Ht 6' (1.829 m)  Wt 238 lb 12.8 oz (108.319 kg)  BMI 32.38 kg/m2  EXAM: General appearance: alert and no distress Neck: no carotid bruit, no JVD and thyroid not enlarged, symmetric, no tenderness/mass/nodules Lungs: clear to auscultation bilaterally Heart: regular rate and rhythm, S1, S2 normal, no murmur, click, rub or gallop Abdomen: soft, non-tender; bowel sounds normal; no masses,  no organomegaly and obese Extremities: extremities normal, atraumatic, no cyanosis or edema Pulses: 2+ and symmetric Skin: Skin color, texture, turgor normal. No rashes or lesions Neurologic: Grossly normal Psych: Somewhat irritated  EKG:  Sinus rhythm at 80  ASSESSMENT: 1. Coronary artery disease status post four-vessel CABG in 2005 2. Recent  NSTEMI with PCI to the SVG-RCA 3. 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 Hypertension 5. Dyslipidemia 6. Obesity  PLAN: 1.   Mr. Stavola and is doing well after his cardiac catheterization. He had no groin complications but did have some ecchymosis which is improving. He's had no further chest pain. He is interested in following up in [place] and will refer him to one of my partners in that office. I will also help arrange cardiac rehabilitation at [facility].  He's currently on good medications for treatment of his hypertension and dyslipidemia. I've encouraged rehabilitation, exercise, work on weight loss and dietary changes. He was reminded not to miss any of his doses of aspirin and Brillinta for the next year.   Pixie Casino, MD, [facility] Attending Cardiologist [facility]  Krista Som C 10/29/2013, 1:17 PM

## 2013-10-29 NOTE — Patient Instructions (Addendum)
Your physician recommends that you schedule a follow-up appointment in 3 months at the [facility] office - Dr. Bryna Colander have been referred to [facility] for cardiac rehab.

## 2014-02-21 ENCOUNTER — Encounter: Payer: Self-pay | Admitting: Cardiology

## 2014-02-21 ENCOUNTER — Ambulatory Visit (INDEPENDENT_AMBULATORY_CARE_PROVIDER_SITE_OTHER): Payer: Medicare HMO | Admitting: Cardiology

## 2014-02-21 VITALS — BP 136/83 | HR 77 | Ht 72.0 in | Wt 237.0 lb

## 2014-02-21 DIAGNOSIS — E785 Hyperlipidemia, unspecified: Secondary | ICD-10-CM

## 2014-02-21 DIAGNOSIS — I2510 Atherosclerotic heart disease of native coronary artery without angina pectoris: Secondary | ICD-10-CM

## 2014-02-21 DIAGNOSIS — I10 Essential (primary) hypertension: Secondary | ICD-10-CM

## 2014-02-21 MED ORDER — CLOPIDOGREL BISULFATE 75 MG PO TABS: 75.0000 mg | ORAL_TABLET | Freq: Every day | ORAL | Status: AC

## 2014-02-21 NOTE — Progress Notes (Signed)
Clinical Summary Mr. Levan is a 63 y.o.male former patient of Dr Debara Pickett, this is our first visit together. He is seen for the following medical problems.  1. CAD - prior CABG in 2005 (LIMA-LAD, SVG-Diag, SVG- LCX, SVG-RCA).  - NSTEMI Jan 2015, cath showed occluded SVG-RCA which received a DES. LVEF by LV gram 40% - denies any chest pain, no SOB, no DOE - compliant with meds. Easy bruising and feels cold all over since starting brillinta, decreased brillinta to once a day approx in Feb on his own.  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   2. HTN - does not check at home - compliant with meds  3. Hyperlipidemia - reports labs pending at end of this month - Jan 2015 TC 145  TG 114 HDL 35 LDL 87     Past Medical History  Diagnosis Date  . Arthritis   . Hypertension   . Coronary artery disease     h/o CABG  . Myocardial infarction   . Hypothyroidism   . Anginal pain   . History of tobacco abuse      Allergies  Allergen Reactions  . Penicillins     Swelling in extremeties     Current Outpatient Prescriptions  Medication Sig Dispense Refill  . acetaminophen (TYLENOL) 325 MG tablet Take 2 tablets (650 mg total) by mouth every 4 (four) hours as needed for headache or mild pain.      Marland Kitchen aspirin EC 81 MG EC tablet Take 1 tablet (81 mg total) by mouth daily.      Marland Kitchen atorvastatin (LIPITOR) 80 MG tablet Take 80 mg by mouth daily.      . carvedilol (COREG) 6.25 MG tablet Take 1 tablet (6.25 mg total) by mouth 2 (two) times daily with a meal.  60 tablet  11  . isosorbide mononitrate (IMDUR) 15 mg TB24 24 hr tablet Take 0.5 tablets (15 mg total) by mouth daily.  45 tablet  3  . levothyroxine (SYNTHROID, LEVOTHROID) 150 MCG tablet Take 150 mcg by mouth daily before breakfast.      . lisinopril (PRINIVIL,ZESTRIL) 5 MG tablet Take 1 tablet (5 mg total) by mouth daily.  90 tablet  3  . meloxicam (MOBIC) 15 MG tablet Take 1 tablet  (15 mg total) by mouth daily as needed for pain.      . nitroGLYCERIN (NITROSTAT) 0.4 MG SL tablet Place 1 tablet (0.4 mg total) under the tongue every 5 (five) minutes as needed for chest pain.  25 tablet  2  . Ticagrelor (BRILINTA) 90 MG TABS tablet Take 1 tablet (90 mg total) by mouth 2 (two) times daily.  60 tablet  11   No current facility-administered medications for this visit.     Past Surgical History  Procedure Laterality Date  . Coronary artery bypass graft  07/17/2004    CABGx4 - LIMA to distal LAD, SVG to 1st diagonal, SVG to Cfx marginal, SVG to distal RCA (Dr. Remus Loffler)  . Cardiac catheterization  07/12/2004    LAD 70% narrowing, 95% mildly segmental stenosis of LAD beyond second diagonal, 95% stenosis of large prox inferior bifurcation branch of diagonal, Cfx had 60-70% stenosis, 50% irregularity and narrowing allong the prox band and another 60% narrowing of mid-portion, RV branch from prox 3rd with 95% prox stenosis (Dr. Marella Chimes) - subsequent CABG  . Back surgery  Allergies  Allergen Reactions  . Penicillins     Swelling in extremeties      No family history on file.   Social History Mr. Anglin reports that he has been smoking Cigars.  He has quit using smokeless tobacco. Mr. Klugh reports that he does not drink alcohol.   Review of Systems CONSTITUTIONAL: No weight loss, fever, chills, weakness or fatigue.  HEENT: Eyes: No visual loss, blurredvision, double vision or yellow sclerae.No hearing loss, sneezing, congestion, runny nose or sore throat.  SKIN: No rash or itching.  CARDIOVASCULAR: per H RESPIRATORY: No shortness of breath, cough or sputum.  GASTROINTESTINAL: No anorexia, nausea, vomiting or diarrhea. No abdominal pain or blood.  GENITOURINARY: No burning on urination, no polyuria NEUROLOGICAL: No headache, dizziness, syncope, paralysis, ataxia, numbness or tingling in the extremities. No change in bowel or bladder control.    MUSCULOSKELETAL: No muscle, back pain, joint pain or stiffness.  LYMPHATICS: No enlarged nodes. No history of splenectomy.  PSYCHIATRIC: No history of depression or anxiety.  ENDOCRINOLOGIC: No reports of sweating, cold or heat intolerance. No polyuria or polydipsia.  Marland Kitchen   Physical Examination p 77 bp 136/83 Wt 237 lbs BMI 32 Gen: resting comfortably, no acute distress HEENT: no scleral icterus, pupils equal round and reactive, no palptable cervical adenopathy,  CV: RRR, no m/r/g, no JVD Resp: Clear to auscultation bilaterally GI: abdomen is soft, non-tender, non-distended, normal bowel sounds, no hepatosplenomegaly MSK: extremities are warm, no edema.  Skin: warm, no rash Neuro:  no focal deficits Psych: appropriate affect    Assessment and Plan   1. CAD - no current symptoms - LVEF 40% in Jan 2015 in setting of occluded RCA graft, will repeat echo to reevaluate. If persistent LV dysfunction will need further titration of medications. -describes chills and easy bruising on brillinta, will stop and change to plavix. Plan for DAPT at least until Jan 2016  2. HTN - at goal, continue current mends  3. Hyperlipdemia - f/u upcoming lipid panel, continue current statin  F/u 6 months     Arnoldo Lenis, M.D., F.A.C.C.

## 2014-02-21 NOTE — Patient Instructions (Signed)
Your physician has requested that you have an echocardiogram. Echocardiography is a painless test that uses sound waves to create images of your heart. It provides your doctor with information about the size and shape of your heart and how well your heart's chambers and valves are working. This procedure takes approximately one hour. There are no restrictions for this procedure. Office will contact with results via phone or letter.   Stop Brilanta Begin Plavix 75mg  daily - new sent to pharm  Continue all other medications.   Your physician wants you to follow up in: 6 months.  You will receive a reminder letter in the mail one-two months in advance.  If you don't receive a letter, please call our office to schedule the follow up appointment

## 2014-03-02 ENCOUNTER — Other Ambulatory Visit (HOSPITAL_COMMUNITY): Payer: Medicare HMO

## 2014-03-09 ENCOUNTER — Encounter: Payer: Self-pay | Admitting: Cardiovascular Disease

## 2014-03-16 ENCOUNTER — Ambulatory Visit (HOSPITAL_COMMUNITY)
Admission: RE | Admit: 2014-03-16 | Discharge: 2014-03-16 | Disposition: A | Discharge status: Home / Self Care | Payer: Medicare HMO | Source: Ambulatory Visit | Attending: Cardiology | Admitting: Cardiology

## 2014-03-16 DIAGNOSIS — I2510 Atherosclerotic heart disease of native coronary artery without angina pectoris: Secondary | ICD-10-CM | POA: Insufficient documentation

## 2014-03-16 DIAGNOSIS — I517 Cardiomegaly: Secondary | ICD-10-CM

## 2014-03-16 NOTE — Progress Notes (Signed)
*  PRELIMINARY RESULTS* Echocardiogram 2D Echocardiogram has been performed.  Leavy Cella 03/16/2014, 9:12 AM

## 2014-03-22 ENCOUNTER — Telehealth: Payer: Self-pay | Admitting: *Deleted

## 2014-03-22 NOTE — Telephone Encounter (Deleted)
Notes Recorded by Laurine Blazer, LPN on 01/17/9671 at 8:97 PM Patient notified. OV scheduled this Thursday, 03/24/2014 with Dr. Harl Bowie.

## 2014-03-22 NOTE — Telephone Encounter (Signed)
Message copied by Laurine Blazer on Tue Mar 22, 2014  4:58 PM ------      Message from: [place] F      Created: Mon Mar 21, 2014 12:41 PM       Heart function has improved and is back to normal based on recent echo            Zandra Abts MD ------

## 2014-03-23 NOTE — Telephone Encounter (Signed)
Notes Recorded by Laurine Blazer, LPN on 0/11/1279 at 1:88 AM Patient notified. ------

## 2014-08-25 ENCOUNTER — Encounter (HOSPITAL_COMMUNITY): Payer: Self-pay | Admitting: Cardiovascular Disease

## 2015-07-06 ENCOUNTER — Ambulatory Visit (HOSPITAL_COMMUNITY)
Admission: RE | Admit: 2015-07-06 | Discharge: 2015-07-06 | Disposition: A | Discharge status: Home / Self Care | Payer: Medicare HMO | Source: Ambulatory Visit | Attending: Otolaryngology | Admitting: Otolaryngology

## 2015-07-06 ENCOUNTER — Other Ambulatory Visit (HOSPITAL_COMMUNITY): Payer: Self-pay | Admitting: Otolaryngology

## 2015-07-06 ENCOUNTER — Encounter (HOSPITAL_COMMUNITY): Payer: Self-pay

## 2015-07-06 DIAGNOSIS — C321 Malignant neoplasm of supraglottis: Secondary | ICD-10-CM

## 2015-07-06 DIAGNOSIS — I7090 Unspecified atherosclerosis: Secondary | ICD-10-CM | POA: Insufficient documentation

## 2015-07-06 DIAGNOSIS — M47892 Other spondylosis, cervical region: Secondary | ICD-10-CM | POA: Insufficient documentation

## 2015-07-06 LAB — POCT I-STAT CREATININE: CREATININE: 1.1 mg/dL (ref 0.61–1.24)

## 2015-07-06 MED ORDER — IOHEXOL 300 MG/ML  SOLN
75.0000 mL | Freq: Once | INTRAMUSCULAR | Status: AC | PRN
  Administered 2015-07-06: 75 mL via INTRAVENOUS

## 2015-07-07 NOTE — Pre-Procedure Instructions (Signed)
BRAELYN BORDONARO  07/07/2015      [facility] [address] [place] - [address] [place] [address] Phone: [phone] Fax: [phone]    Your procedure is scheduled on Wednesday, July 12, 2015  Report to [facility] Admitting at 10:15 A.M.  Call this number if you have problems the morning of surgery:  [phone]   Remember:  Do not eat food or drink liquids after midnight Tuesday, July 11, 2015  Take these medicines the morning of surgery with A SIP OF WATER: carvedilol (COREG),  isosorbide mononitrate (IMDUR), levothyroxine (SYNTHROID, LEVOTHROID), if needed: nitroGLYCERIN (NITROSTAT) for chest pain. Stop taking vitamins and herbal medications such as Vitamin E. Do not take any NSAIDs ie: Ibuprofen, Advil, Naproxen or diclofenac (VOLTAREN)  Do not wear jewelry, make-up or nail polish.  Do not wear lotions, powders, or perfumes.  You may not wear deodorant.  Do not shave 48 hours prior to surgery.  Men may shave face and neck.  Do not bring valuables to the hospital.  [facility] is not responsible for any belongings or valuables.  Contacts, dentures or bridgework may not be worn into surgery.  Leave your suitcase in the car.  After surgery it may be brought to your room.  For patients admitted to the hospital, discharge time will be determined by your treatment team.  Patients discharged the day of surgery will not be allowed to drive home.   Name and phone number of your driver:    Special instructions: Special Instructions:Special Instructions: [facility] - Preparing for Surgery  Before surgery, you can play an important role.  Because skin is not sterile, your skin needs to be as free of germs as possible.  You can reduce the number of germs on you skin by washing with CHG (chlorahexidine gluconate) soap before surgery.  CHG is an antiseptic cleaner which kills germs and bonds with the skin to continue killing germs even after  washing.  Please DO NOT use if you have an allergy to CHG or antibacterial soaps.  If your skin becomes reddened/irritated stop using the CHG and inform your nurse when you arrive at Short Stay.  Do not shave (including legs and underarms) for at least 48 hours prior to the first CHG shower.  You may shave your face.  Please follow these instructions carefully:   1.  Shower with CHG Soap the night before surgery and the morning of Surgery.  2.  If you choose to wash your hair, wash your hair first as usual with your normal shampoo.  3.  After you shampoo, rinse your hair and body thoroughly to remove the Shampoo.  4.  Use CHG as you would any other liquid soap.  You can apply chg directly  to the skin and wash gently with scrungie or a clean washcloth.  5.  Apply the CHG Soap to your body ONLY FROM THE NECK DOWN.  Do not use on open wounds or open sores.  Avoid contact with your eyes, ears, mouth and genitals (private parts).  Wash genitals (private parts) with your normal soap.  6.  Wash thoroughly, paying special attention to the area where your surgery will be performed.  7.  Thoroughly rinse your body with warm water from the neck down.  8.  DO NOT shower/wash with your normal soap after using and rinsing off the CHG Soap.  9.  Pat yourself dry with a clean  towel.            10.  Wear clean pajamas.            11.  Place clean sheets on your bed the night of your first shower and do not sleep with pets.  Day of Surgery  Do not apply any lotions/deodorants the morning of surgery.  Please wear clean clothes to the hospital/surgery center.  Please read over the following fact sheets that you were given. Pain Booklet, Coughing and Deep Breathing and Surgical Site Infection Prevention

## 2015-07-10 ENCOUNTER — Encounter (HOSPITAL_COMMUNITY)
Admission: RE | Admit: 2015-07-10 | Discharge: 2015-07-10 | Disposition: A | Discharge status: Home / Self Care | Payer: Medicare HMO | Source: Ambulatory Visit | Attending: Otolaryngology | Admitting: Otolaryngology

## 2015-07-10 ENCOUNTER — Encounter (HOSPITAL_COMMUNITY): Payer: Self-pay

## 2015-07-10 DIAGNOSIS — F17290 Nicotine dependence, other tobacco product, uncomplicated: Secondary | ICD-10-CM | POA: Diagnosis not present

## 2015-07-10 DIAGNOSIS — C321 Malignant neoplasm of supraglottis: Secondary | ICD-10-CM | POA: Diagnosis present

## 2015-07-10 DIAGNOSIS — I10 Essential (primary) hypertension: Secondary | ICD-10-CM | POA: Diagnosis not present

## 2015-07-10 DIAGNOSIS — Z951 Presence of aortocoronary bypass graft: Secondary | ICD-10-CM | POA: Diagnosis not present

## 2015-07-10 DIAGNOSIS — E039 Hypothyroidism, unspecified: Secondary | ICD-10-CM | POA: Diagnosis not present

## 2015-07-10 DIAGNOSIS — Z880 Allergy status to penicillin: Secondary | ICD-10-CM | POA: Diagnosis not present

## 2015-07-10 DIAGNOSIS — I2510 Atherosclerotic heart disease of native coronary artery without angina pectoris: Secondary | ICD-10-CM | POA: Diagnosis not present

## 2015-07-10 DIAGNOSIS — Z79899 Other long term (current) drug therapy: Secondary | ICD-10-CM | POA: Diagnosis not present

## 2015-07-10 DIAGNOSIS — I2582 Chronic total occlusion of coronary artery: Secondary | ICD-10-CM | POA: Diagnosis not present

## 2015-07-10 DIAGNOSIS — I252 Old myocardial infarction: Secondary | ICD-10-CM | POA: Diagnosis not present

## 2015-07-10 DIAGNOSIS — Z7902 Long term (current) use of antithrombotics/antiplatelets: Secondary | ICD-10-CM | POA: Diagnosis not present

## 2015-07-10 DIAGNOSIS — Z7982 Long term (current) use of aspirin: Secondary | ICD-10-CM | POA: Diagnosis not present

## 2015-07-10 HISTORY — DX: Dysphonia: R49.0

## 2015-07-10 HISTORY — DX: Malignant (primary) neoplasm, unspecified: C80.1

## 2015-07-10 HISTORY — DX: Dysphagia, unspecified: R13.10

## 2015-07-10 HISTORY — DX: Presence of spectacles and contact lenses: Z97.3

## 2015-07-10 LAB — BASIC METABOLIC PANEL WITH GFR
Anion gap: 8 (ref 5–15)
BUN: 16 mg/dL (ref 6–20)
CO2: 24 mmol/L (ref 22–32)
Calcium: 9.6 mg/dL (ref 8.9–10.3)
Chloride: 104 mmol/L (ref 101–111)
Creatinine, Ser: 1 mg/dL (ref 0.61–1.24)
GFR calc Af Amer: >60 mL/min
GFR calc non Af Amer: >60 mL/min
Glucose, Bld: 86 mg/dL (ref 65–99)
Potassium: 4.4 mmol/L (ref 3.5–5.1)
Sodium: 136 mmol/L (ref 135–145)

## 2015-07-10 LAB — CBC
HCT: 45.4 % (ref 39.0–52.0)
Hemoglobin: 15.6 g/dL (ref 13.0–17.0)
MCH: 32.6 pg (ref 26.0–34.0)
MCHC: 34.4 g/dL (ref 30.0–36.0)
MCV: 95 fL (ref 78.0–100.0)
Platelets: 252 K/uL (ref 150–400)
RBC: 4.78 MIL/uL (ref 4.22–5.81)
RDW: 13 % (ref 11.5–15.5)
WBC: 11.6 K/uL — ABNORMAL HIGH (ref 4.0–10.5)

## 2015-07-10 LAB — GLUCOSE, CAPILLARY: Glucose-Capillary: 134 mg/dL — ABNORMAL HIGH (ref 65–99)

## 2015-07-10 NOTE — Progress Notes (Addendum)
Anesthesia Chart Review:  Pt is Jerry Cardenas scheduled for esophagoscopy, direct laryngoscopy on 07/12/2015 with Dr. Redmond Baseman.   Cardiologist is Dr. Harl Bowie, last office visit 02/21/14.   PMH includes: CAD (s/p CABG 2005: LIMA-LAD, SVG-Diag, SVG- LCX, SVG-RCA; DES to VG supplying RCA 2015), MI, HTN, supraglottic cancer. Current smoker. BMI 31.   Medications include: ASA, lipitor, carvedilol, plavix, imdur, levothyroxine. Pt stopped plavix 2 weeks ago.   Preoperative labs reviewed.    EKG 07/10/2015: NSR.   Echo 03/16/2014:  - Left ventricle: The cavity size was normal. Wall thickness was increased in a pattern of mild LVH. There was moderate focal basal hypertrophy of the septum. Systolic function was normal. The estimated ejection fraction was in the range of 55% to 60%. There is hypokinesis of the basalinferior myocardium. Doppler parameters are consistent with abnormal left ventricular relaxation (grade 1 diastolic dysfunction). - Aortic valve: Mildly to moderately calcified annulus. Trileaflet. There was no significant regurgitation. - Mitral valve: Mildly thickened leaflets . There was trivial regurgitation. - Right atrium: The atrium was mildly dilated. Central venous pressure (est): 3 mm Hg. - Atrial septum: A patent foramen ovale cannot be excluded. - Tricuspid valve: There was trivial regurgitation. - Pulmonary arteries: Systolic pressure could not be accurately estimated. - Pericardium, extracardiac: There was no pericardial effusion. - Impressions: Mild LVH with LVEF 55-60%, basal inferior hypokinesis, grade 1diastolic dysfunction. Aortic valve sclerosis without stenosis. Trivial tricuspid regurgitation, unable to assess PASP. Mild right atrial enlargement. Cannot exclude PFO.  Cardiac cath 10/14/2013:  - Moderate LV dysfunction with severe hypo-kinesis involving the basal to mid inferior wall with a ejection fraction of approximately 40%. - Severe native coronary artery disease  with diffuse 80% proximal LAD stenosis before septal and diagonal vessel total occlusion of the LAD beyond this septal perforating artery; occlusion of the native circumflex the ostium but with collaterals supplying the AV groove circumflex with an occluded OM branch from this circumflex vessel, and total mid occlusion of the right carotid artery with left-to-right collaterals arising from the distal circumflex. - Patent left internal mammary artery graft supplying the mid LAD  - Patent saphenous vein graft supplying the diagonal vessel - Patent saphenous vein graft supplying the circumflex marginal vessel - Recently occluded saphenous vein graft supplying the distal RCA. - Successful percutaneous coronary intervention to the vein graft supplying the RCA with PTCA/DES stenting with a 3.0x33 mm Xience Alpine DES stent postdilated 3.25 mm with distal 100% occlusion and TIMI 0 flow being reduced to 0% and TIMI 3 flow, and evidence for diffuse disease in the proximal to mid segment with areas of ectasia and narrowing of 60% in the mid vessel which was not intervened upon.  Cath within 2 years, EF recovered by echo 03/2014. No CV symptoms documented at PAT. If no changes, I anticipate pt can proceed with surgery as scheduled.   Willeen Cass, FNP-BC [facility] Short Stay Surgical Center/Anesthesiology Phone: [phone] 07/10/2015 4:51 PM

## 2015-07-10 NOTE — Progress Notes (Signed)
Pt denies SOB and chest pain but is under the care of Dr. Claiborne Billings, cardiology. Pt denies having a chest x ray and EKG within the last year. Pt stated that he stopped taking Plavix 2 weeks ago but continues to take Aspirin. Spoke with Ailene Ards, CMA  regarding pt pre-op Aspirin instructions; according to Ailene Ards, CMA, it is okay for pt to continue taking Aspirin. Pt chart forwarded to [place], [place], anesthesia, to review cardiac history.

## 2015-07-12 ENCOUNTER — Encounter (HOSPITAL_COMMUNITY): Payer: Self-pay | Admitting: *Deleted

## 2015-07-12 ENCOUNTER — Ambulatory Visit (HOSPITAL_COMMUNITY)
Admission: RE | Admit: 2015-07-12 | Discharge: 2015-07-12 | Disposition: A | Discharge status: Home / Self Care | Payer: Medicare HMO | Source: Ambulatory Visit | Attending: Otolaryngology | Admitting: Otolaryngology

## 2015-07-12 ENCOUNTER — Encounter (HOSPITAL_COMMUNITY)
Admission: RE | Disposition: A | Discharge status: Home / Self Care | Payer: Self-pay | Source: Ambulatory Visit | Attending: Otolaryngology

## 2015-07-12 ENCOUNTER — Ambulatory Visit (HOSPITAL_COMMUNITY): Payer: Medicare HMO | Admitting: Emergency Medicine

## 2015-07-12 ENCOUNTER — Ambulatory Visit (HOSPITAL_COMMUNITY): Payer: Medicare HMO | Admitting: Anesthesiology

## 2015-07-12 DIAGNOSIS — Z880 Allergy status to penicillin: Secondary | ICD-10-CM | POA: Insufficient documentation

## 2015-07-12 DIAGNOSIS — I2582 Chronic total occlusion of coronary artery: Secondary | ICD-10-CM | POA: Insufficient documentation

## 2015-07-12 DIAGNOSIS — I2510 Atherosclerotic heart disease of native coronary artery without angina pectoris: Secondary | ICD-10-CM | POA: Insufficient documentation

## 2015-07-12 DIAGNOSIS — I252 Old myocardial infarction: Secondary | ICD-10-CM | POA: Insufficient documentation

## 2015-07-12 DIAGNOSIS — Z7902 Long term (current) use of antithrombotics/antiplatelets: Secondary | ICD-10-CM | POA: Insufficient documentation

## 2015-07-12 DIAGNOSIS — C321 Malignant neoplasm of supraglottis: Secondary | ICD-10-CM | POA: Diagnosis not present

## 2015-07-12 DIAGNOSIS — F17290 Nicotine dependence, other tobacco product, uncomplicated: Secondary | ICD-10-CM | POA: Insufficient documentation

## 2015-07-12 DIAGNOSIS — E039 Hypothyroidism, unspecified: Secondary | ICD-10-CM | POA: Insufficient documentation

## 2015-07-12 DIAGNOSIS — Z79899 Other long term (current) drug therapy: Secondary | ICD-10-CM | POA: Insufficient documentation

## 2015-07-12 DIAGNOSIS — I10 Essential (primary) hypertension: Secondary | ICD-10-CM | POA: Insufficient documentation

## 2015-07-12 DIAGNOSIS — Z951 Presence of aortocoronary bypass graft: Secondary | ICD-10-CM | POA: Diagnosis not present

## 2015-07-12 DIAGNOSIS — Z7982 Long term (current) use of aspirin: Secondary | ICD-10-CM | POA: Insufficient documentation

## 2015-07-12 HISTORY — PX: ESOPHAGOSCOPY: SHX5534

## 2015-07-12 HISTORY — PX: DIRECT LARYNGOSCOPY: SHX5326

## 2015-07-12 SURGERY — ESOPHAGOSCOPY
Anesthesia: General

## 2015-07-12 MED ORDER — 0.9 % SODIUM CHLORIDE (POUR BTL) OPTIME
TOPICAL | Status: DC | PRN
  Administered 2015-07-12: 1000 mL

## 2015-07-12 MED ORDER — LACTATED RINGERS IV SOLN
INTRAVENOUS | Status: DC
  Administered 2015-07-12 (×2): via INTRAVENOUS

## 2015-07-12 MED ORDER — MIDAZOLAM HCL 5 MG/5ML IJ SOLN
INTRAMUSCULAR | Status: DC | PRN
  Administered 2015-07-12: 2 mg via INTRAVENOUS

## 2015-07-12 MED ORDER — PROPOFOL 10 MG/ML IV BOLUS
INTRAVENOUS | Status: DC | PRN
  Administered 2015-07-12: 20 mg via INTRAVENOUS
  Administered 2015-07-12: 50 mg via INTRAVENOUS
  Administered 2015-07-12: 150 mg via INTRAVENOUS

## 2015-07-12 MED ORDER — PROPOFOL 10 MG/ML IV BOLUS
INTRAVENOUS | Status: AC
  Filled 2015-07-12: qty 20

## 2015-07-12 MED ORDER — MIDAZOLAM HCL 2 MG/2ML IJ SOLN
INTRAMUSCULAR | Status: AC
  Filled 2015-07-12: qty 4

## 2015-07-12 MED ORDER — SODIUM CHLORIDE 0.9 % IJ SOLN
INTRAMUSCULAR | Status: AC
  Filled 2015-07-12: qty 10

## 2015-07-12 MED ORDER — FENTANYL CITRATE (PF) 250 MCG/5ML IJ SOLN
INTRAMUSCULAR | Status: AC
Start: 2015-07-12 — End: 2015-07-12
  Filled 2015-07-12: qty 5

## 2015-07-12 MED ORDER — ONDANSETRON HCL 4 MG/2ML IJ SOLN
INTRAMUSCULAR | Status: DC | PRN
  Administered 2015-07-12: 4 mg via INTRAVENOUS

## 2015-07-12 MED ORDER — EPHEDRINE SULFATE 50 MG/ML IJ SOLN
INTRAMUSCULAR | Status: AC
  Filled 2015-07-12: qty 2

## 2015-07-12 MED ORDER — FENTANYL CITRATE (PF) 100 MCG/2ML IJ SOLN
INTRAMUSCULAR | Status: DC | PRN
  Administered 2015-07-12: 50 ug via INTRAVENOUS

## 2015-07-12 MED ORDER — DEXAMETHASONE SODIUM PHOSPHATE 4 MG/ML IJ SOLN
INTRAMUSCULAR | Status: DC | PRN
  Administered 2015-07-12: 4 mg via INTRAVENOUS

## 2015-07-12 MED ORDER — HYDROMORPHONE HCL 1 MG/ML IJ SOLN: 0.2500 mg | INTRAMUSCULAR | Status: DC | PRN

## 2015-07-12 MED ORDER — PROMETHAZINE HCL 25 MG/ML IJ SOLN: 6.2500 mg | INTRAMUSCULAR | Status: DC | PRN

## 2015-07-12 MED ORDER — SUCCINYLCHOLINE CHLORIDE 20 MG/ML IJ SOLN
INTRAMUSCULAR | Status: DC | PRN
  Administered 2015-07-12: 120 mg via INTRAVENOUS

## 2015-07-12 MED ORDER — LIDOCAINE HCL (CARDIAC) 20 MG/ML IV SOLN
INTRAVENOUS | Status: DC | PRN
  Administered 2015-07-12: 80 mg via INTRAVENOUS

## 2015-07-12 SURGICAL SUPPLY — 32 items
BALLN PULM 15 16.5 18 X 75CM (BALLOONS)
BALLN PULM 15 16.5 18X75 (BALLOONS)
BALLOON PULM 15 16.5 18X75 (BALLOONS) IMPLANT
BLADE SURG 15 STRL LF DISP TIS (BLADE) IMPLANT
BLADE SURG 15 STRL SS (BLADE)
CANISTER SUCTION 2500CC (MISCELLANEOUS) ×3 IMPLANT
CONT SPEC 4OZ CLIKSEAL STRL BL (MISCELLANEOUS) ×2 IMPLANT
COVER MAYO STAND STRL (DRAPES) ×3 IMPLANT
COVER TABLE BACK 60X90 (DRAPES) ×3 IMPLANT
CRADLE DONUT ADULT HEAD (MISCELLANEOUS) ×2 IMPLANT
DRAPE PROXIMA HALF (DRAPES) ×3 IMPLANT
GAUZE SPONGE 4X4 16PLY XRAY LF (GAUZE/BANDAGES/DRESSINGS) ×3 IMPLANT
GLOVE BIO SURGEON STRL SZ7.5 (GLOVE) ×3 IMPLANT
GLOVE BIOGEL PI IND STRL 7.0 (GLOVE) IMPLANT
GLOVE BIOGEL PI INDICATOR 7.0 (GLOVE) ×2
GLOVE ECLIPSE 7.0 STRL STRAW (GLOVE) ×2 IMPLANT
GOWN STRL REUS W/ TWL LRG LVL3 (GOWN DISPOSABLE) IMPLANT
GOWN STRL REUS W/TWL LRG LVL3 (GOWN DISPOSABLE)
GUARD TEETH (MISCELLANEOUS) ×3 IMPLANT
KIT ROOM TURNOVER OR (KITS) ×3 IMPLANT
NDL HYPO 25GX1X1/2 BEV (NEEDLE) IMPLANT
NDL TRANS ORAL INJECTION (NEEDLE) IMPLANT
NEEDLE HYPO 25GX1X1/2 BEV (NEEDLE) IMPLANT
NEEDLE TRANS ORAL INJECTION (NEEDLE) IMPLANT
NS IRRIG 1000ML POUR BTL (IV SOLUTION) ×3 IMPLANT
PAD ARMBOARD 7.5X6 YLW CONV (MISCELLANEOUS) ×4 IMPLANT
PATTIES SURGICAL .5 X3 (DISPOSABLE) IMPLANT
SOLUTION ANTI FOG 6CC (MISCELLANEOUS) IMPLANT
SURGILUBE 2OZ TUBE FLIPTOP (MISCELLANEOUS) IMPLANT
TOWEL OR 17X24 6PK STRL BLUE (TOWEL DISPOSABLE) ×6 IMPLANT
TUBE CONNECTING 12'X1/4 (SUCTIONS) ×1
TUBE CONNECTING 12X1/4 (SUCTIONS) ×2 IMPLANT

## 2015-07-12 NOTE — Anesthesia Procedure Notes (Signed)
Procedure Name: Intubation Date/Time: 07/12/2015 12:07 PM Performed by: Merrilyn Puma B Pre-anesthesia Checklist: Patient identified, Timeout performed, Emergency Drugs available, Patient being monitored and Suction available Patient Re-evaluated:Patient Re-evaluated prior to inductionOxygen Delivery Method: Circle system utilized Preoxygenation: Pre-oxygenation with 100% oxygen Intubation Type: IV induction Ventilation: Mask ventilation without difficulty Grade View: Grade I Tube type: Oral Tube size: 7.0 mm Number of attempts: 1 Airway Equipment and Method: Stylet and Video-laryngoscopy Placement Confirmation: CO2 detector,  positive ETCO2,  ETT inserted through vocal cords under direct vision and breath sounds checked- equal and bilateral Secured at: 22 cm Tube secured with: Tape Dental Injury: Teeth and Oropharynx as per pre-operative assessment

## 2015-07-12 NOTE — Anesthesia Postprocedure Evaluation (Signed)
  Anesthesia Post-op Note  Patient: Jerry Cardenas  Procedure(s) Performed: Procedure(s) (LRB): ESOPHAGOSCOPY (N/A) DIRECT LARYNGOSCOPY WITH BIOPSY (N/A)  Patient Location: PACU  Anesthesia Type: General  Level of Consciousness: awake and alert   Airway and Oxygen Therapy: Patient Spontanous Breathing  Post-op Pain: mild  Post-op Assessment: Post-op Vital signs reviewed, Patient's Cardiovascular Status Stable, Respiratory Function Stable, Patent Airway and No signs of Nausea or vomiting  Last Vitals:  Filed Vitals:   07/12/15 1330  BP: 141/79  Pulse: 67  Temp:   Resp:     Post-op Vital Signs: stable   Complications: No apparent anesthesia complications

## 2015-07-12 NOTE — Transfer of Care (Signed)
Immediate Anesthesia Transfer of Care Note  Patient: Jerry Cardenas  Procedure(s) Performed: Procedure(s) with comments: ESOPHAGOSCOPY (N/A) DIRECT LARYNGOSCOPY WITH BIOPSY (N/A) - DL WITH BIOPSY  Patient Location: PACU  Anesthesia Type:General  Level of Consciousness: sedated  Airway & Oxygen Therapy: Patient Spontanous Breathing and Patient connected to nasal cannula oxygen  Post-op Assessment: Report given to RN, Post -op Vital signs reviewed and stable and Patient moving all extremities  Post vital signs: Reviewed and stable  Last Vitals:  Filed Vitals:   07/12/15 0945  BP: 126/72  Pulse: 72  Temp: 36.8 C  Resp: 20    Complications: No apparent anesthesia complications

## 2015-07-12 NOTE — Brief Op Note (Signed)
07/12/2015  12:26 PM  PATIENT:  Jerry Cardenas  64 y.o. male  PRE-OPERATIVE DIAGNOSIS:  Supraglottic cancer  POST-OPERATIVE DIAGNOSIS:  same  PROCEDURE:  Procedure(s) with comments: ESOPHAGOSCOPY (N/A) DIRECT LARYNGOSCOPY WITH BIOPSY (N/A) - DL WITH BIOPSY  SURGEON:  Surgeon(s) and Role:    * Melida Quitter, MD - Primary  PHYSICIAN ASSISTANT:   ASSISTANTS: none   ANESTHESIA:   general  EBL:     BLOOD ADMINISTERED:none  DRAINS: none   LOCAL MEDICATIONS USED:  NONE  SPECIMEN:  Source of Specimen:  Laryngeal surface of epiglottis  DISPOSITION OF SPECIMEN:  PATHOLOGY  COUNTS:  YES  TOURNIQUET:  * No tourniquets in log *  DICTATION: .Other Dictation: Dictation Number [id number]  PLAN OF CARE: Discharge to home after PACU  PATIENT DISPOSITION:  PACU - hemodynamically stable.   Delay start of Pharmacological VTE agent (>24hrs) due to surgical blood loss or risk of bleeding: no

## 2015-07-12 NOTE — H&P (Signed)
Jerry Cardenas is an 64 y.o. male.   Chief Complaint: laryngeal mass, hoarseness HPI: 64 year old male complaining of right-sided sore throat for six months with some difficulty swallowing for the past few weeks.  He has pain that radiates to the right ear.  His voice has been hoarse for about three months.  In the office, he was found to have a laryngeal mass and presents for biopsy.  Past Medical History  Diagnosis Date  . Arthritis   . Hypertension   . Coronary artery disease     h/o CABG  . Myocardial infarction ([place])   . Hypothyroidism   . Anginal pain ([place])   . History of tobacco abuse   . Cancer ([place])     supraglottic cancer  . Dysphonia   . Dysphagia   . Wears glasses     Past Surgical History  Procedure Laterality Date  . Coronary artery bypass graft  07/17/2004    CABGx4 - LIMA to distal LAD, SVG to 1st diagonal, SVG to Cfx marginal, SVG to distal RCA (Dr. Remus Loffler)  . Cardiac catheterization  07/12/2004    LAD 70% narrowing, 95% mildly segmental stenosis of LAD beyond second diagonal, 95% stenosis of large prox inferior bifurcation branch of diagonal, Cfx had 60-70% stenosis, 50% irregularity and narrowing allong the prox band and another 60% narrowing of mid-portion, RV branch from prox 3rd with 95% prox stenosis (Dr. Marella Chimes) - subsequent CABG  . Back surgery    . Left heart catheterization with coronary angiogram N/A 10/14/2013    Procedure: LEFT HEART CATHETERIZATION WITH CORONARY ANGIOGRAM;  Surgeon: Troy Sine, MD;  Location: [facility] CATH LAB;  Service: Cardiovascular;  Laterality: N/A;  . Bilateral carpal tunnel release      Family History  Problem Relation Age of Onset  . Leukemia Father   . Lung cancer Brother    Social History:  reports that he has been smoking Cigars.  He has quit using smokeless tobacco. His smokeless tobacco use included Chew. He reports that he does not drink alcohol or use illicit drugs.  Allergies:  Allergies  Allergen  Reactions  . Penicillins     Swelling in extremeties    Medications Prior to Admission  Medication Sig Dispense Refill  . aspirin EC 81 MG EC tablet Take 1 tablet (81 mg total) by mouth daily.    Marland Kitchen atorvastatin (LIPITOR) 80 MG tablet Take 80 mg by mouth daily.    . carvedilol (COREG) 6.25 MG tablet Take 1 tablet (6.25 mg total) by mouth 2 (two) times daily with a meal. 60 tablet 11  . clopidogrel (PLAVIX) 75 MG tablet Take 1 tablet (75 mg total) by mouth daily. 30 tablet 6  . diclofenac (VOLTAREN) 75 MG EC tablet Take 75 mg by mouth 2 (two) times daily as needed for mild pain.    . isosorbide mononitrate (IMDUR) 15 mg TB24 24 hr tablet Take 0.5 tablets (15 mg total) by mouth daily. 45 tablet 3  . levothyroxine (SYNTHROID, LEVOTHROID) 150 MCG tablet Take 150 mcg by mouth daily before breakfast.    . nitroGLYCERIN (NITROSTAT) 0.4 MG SL tablet Place 1 tablet (0.4 mg total) under the tongue every 5 (five) minutes as needed for chest pain. 25 tablet 2  . vitamin E 400 UNIT capsule Take 400 Units by mouth daily.      No results found for this or any previous visit (from the past 48 hour(s)). No results found.  Review  of Systems  All other systems reviewed and are negative.   Blood pressure 126/72, pulse 72, temperature 98.2 F (36.8 C), temperature source Oral, resp. rate 20, weight 102.967 kg (227 lb), SpO2 98 %. Physical Exam  Constitutional: He is oriented to person, place, and time. He appears well-developed and well-nourished. No distress.  HENT:  Head: Normocephalic and atraumatic.  Right Ear: External ear normal.  Left Ear: External ear normal.  Nose: Nose normal.  Mouth/Throat: Oropharynx is clear and moist.  Moderate hoarseness  Eyes: Conjunctivae and EOM are normal. Pupils are equal, round, and reactive to light.  Neck: Normal range of motion. Neck supple.  Cardiovascular: Normal rate.   Respiratory: Effort normal.  Musculoskeletal: Normal range of motion.  Neurological:  He is alert and oriented to person, place, and time. No cranial nerve deficit.  Skin: Skin is warm and dry.  Psychiatric: He has a normal mood and affect. His behavior is normal. Judgment and thought content normal.     Assessment/Plan Laryngeal mass, hoarseness, throat pain To OR for direct laryngoscopy with biopsy and esophagoscopy.  Thi Klich 07/12/2015, 11:33 AM

## 2015-07-12 NOTE — Anesthesia Preprocedure Evaluation (Addendum)
Anesthesia Evaluation  Patient identified by MRN, date of birth, ID band Patient awake  General Assessment Comment:Irregular right-sided supraglottic tumor extending from the false vocal cords superiorly to the level of the vallecula. The tumor measures 4.2 x 3.2 x 1.8 cm. The tumor does cross midline below the level of the epiglottis.  Reviewed: Allergy & Precautions, NPO status , Patient's Chart, lab work & pertinent test results  Airway Mallampati: II  TM Distance: >3 FB Neck ROM: Full    Dental no notable dental hx.    Pulmonary Current Smoker,    Pulmonary exam normal breath sounds clear to auscultation       Cardiovascular hypertension, + CAD, + Past MI, + CABG and +CHF  Normal cardiovascular exam Rhythm:Regular Rate:Normal  Cardiomyopathy, ischemic EF 40%      Neuro/Psych negative neurological ROS  negative psych ROS   GI/Hepatic negative GI ROS, Neg liver ROS,   Endo/Other  Hypothyroidism   Renal/GU negative Renal ROS  negative genitourinary   Musculoskeletal negative musculoskeletal ROS (+)   Abdominal   Peds negative pediatric ROS (+)  Hematology negative hematology ROS (+)   Anesthesia Other Findings   Reproductive/Obstetrics negative OB ROS                            Anesthesia Physical Anesthesia Plan  ASA: III  Anesthesia Plan: General   Post-op Pain Management:    Induction: Intravenous  Airway Management Planned: Oral ETT and Video Laryngoscope Planned  Additional Equipment:   Intra-op Plan:   Post-operative Plan: Extubation in OR  Informed Consent: I have reviewed the patients History and Physical, chart, labs and discussed the procedure including the risks, benefits and alternatives for the proposed anesthesia with the patient or authorized representative who has indicated his/her understanding and acceptance.   Dental advisory given  Plan Discussed  with: CRNA and Surgeon  Anesthesia Plan Comments: (Intubate with sux)        Anesthesia Quick Evaluation

## 2015-07-13 ENCOUNTER — Encounter (HOSPITAL_COMMUNITY): Payer: Self-pay | Admitting: Otolaryngology

## 2015-07-13 NOTE — Op Note (Signed)
NAME:  Jerry Cardenas, Jerry Cardenas NO.:  [id number]  MEDICAL RECORD NO.:  [id number]  LOCATION:  [facility]                         FACILITY:  [facility]  PHYSICIAN:  Onnie Graham, MD     DATE OF BIRTH:  05-15-1951  DATE OF PROCEDURE:  07/12/2015 DATE OF DISCHARGE:  07/12/2015                              OPERATIVE REPORT   PREOPERATIVE DIAGNOSES: 1. Supraglottic laryngeal mass. 2. Dysphonia. 3. Sore throat.  POSTOPERATIVE DIAGNOSES: 1. Supraglottic laryngeal mass. 2. Dysphonia. 3. Sore throat.  PROCEDURE: 1. Direct laryngoscopy with biopsy. 2. Esophagoscopy.  SURGEON:  Onnie Graham, MD  ANESTHESIA:  General endotracheal anesthesia.  COMPLICATIONS:  None.  INDICATION:  The patient is a 64 year old male who has had right-sided sore throat for 6 months associated with 3 months of hoarseness and a few weeks of difficulty swallowing.  He was found to have a mass involving the laryngeal surface of his epiglottis and the right supraglottic larynx and he presents to the operating room for surgical biopsy.  FINDINGS:  There is an irregular mass on the laryngeal surface of the epiglottis in the midline and extending toward the right side involving the aryepiglottic fold and right supraglottic endolarynx.  The mass ins at about the false cord.  The true vocal folds are free of tumor.  The piriform sinuses and postcricoid area are normal as well.  DESCRIPTION OF PROCEDURE:  The patient was identified in the holding room and informed consent having been obtained including the discussion of risks, benefits, alternatives, the patient was brought to the operative suite and put on the operative table in supine position. Anesthesia was induced.  The patient was intubated by anesthesia team without difficulty using a glide scope.  The eyes were taped closed and the bed was turned to 90 degrees from anesthesia.  A damp gauze was placed over the upper gum and an anterior commissure  laryngoscope was then used to view the various areas of the pharynx and larynx including the piriform sinuses, postcricoid area, vallecula as well as the endolarynx.  The scope was then removed.  A cervical esophagoscope was then passed through the mouth and down the esophagus keeping the lumen in view to the full extent of this esophagoscope and then was carefully backed out with normal-appearing esophagus.  The anterior commissure laryngoscope was placed once again and a 0-degree telescope was used to make photographs of the tumor.  Upbiting cup forceps were then used to take a sample of tissue from the laryngeal surface of the epiglottis and this was sent for pathology.  The airway was then suctioned and the laryngoscope was removed.  He was turned back to anesthesia for wake-up, and was extubated and moved to the recovery room in stable condition.     Onnie Graham, MD     DDB/MEDQ  D:  07/12/2015  T:  07/13/2015  Job:  [id number]

## 2015-07-19 ENCOUNTER — Telehealth: Payer: Self-pay | Admitting: *Deleted

## 2015-07-19 NOTE — Telephone Encounter (Signed)
  Oncology Nurse Navigator Documentation Referral date to RadOnc/MedOnc: 07/17/15 (07/19/15 1600) Navigator Encounter Type: Introductory phone call (07/19/15 1600)     Placed introductory call to new referral patient. 1. Introduced myself as the oncology nurse navigator that works with Dr. Isidore Moos to whom he has been referred by Dr. Redmond Baseman. 2. He confirmed understanding of referral and 07/26/15 9:30/10:00 appt. 3. I briefly explained my role as a navigator, indicated that I would be joining him during his appt next week. 4. I confirmed understanding of the [facility] location, explained arrival and RadOnc registration process for appt. 5. I provided my contact information, encouraged him to call with questions/concerns before next week. 6. He verbalized understanding of information provided, expressed appreciation for my call.  Gayleen Orem, RN, BSN, [place] at [place] [phone]                    Time Spent with Patient: 15 (07/19/15 1600)

## 2015-07-21 ENCOUNTER — Telehealth: Payer: Self-pay | Admitting: Hematology and Oncology

## 2015-07-21 NOTE — Telephone Encounter (Signed)
Called pt left vm in ref to new pt appt. On 07/24/15@1 :30. Asked to call back to confirm appt.

## 2015-07-21 NOTE — Telephone Encounter (Signed)
Called pt left vm in ref to np appt on 07/26/15@12 :00

## 2015-07-24 ENCOUNTER — Ambulatory Visit: Payer: Medicare HMO | Admitting: Hematology and Oncology

## 2015-07-24 NOTE — Progress Notes (Addendum)
Head and Neck Cancer Location of Tumor / Histology:  07/12/15 Diagnosis Epiglottis, biopsy, laryngeal surface INVASIVE SQUAMOUS CELL CARCINOMA  Patient presented  with symptoms of: complaints of right-sided sore throat for six months with some difficulty swallowing for the past few weeks. He has pain that radiates to the right ear. His voice has been hoarse for about three months.   Biopsies of Epiglottis, laryngeal surface  revealed: Invasive Squamous Cell Carcinoma  Nutrition Status Yes No Comments  Weight changes? []  [x]  He reports gaining weight recently  Swallowing concerns? [x]  []  He has been modifying his diet, mashed potato, scrambled egg (food with a soft texture). He is able to chew, but gets choked occasionally.   PEG? []  [x]     Referrals Yes No Comments  Social Work? []  [x]    Dentistry? []  [x]  He has no teeth, but does wear upper and lower dentures.  Swallowing therapy? []  [x]    Nutrition? []  [x]    Med/Onc? [x]  []     Safety Issues Yes No Comments  Prior radiation? []  [x]    Pacemaker/ICD? []  [x]    Possible current pregnancy? []  [x]    Is the patient on methotrexate? []  [x]      Tobacco/Marijuana/Snuff/ETOH use: He reports he smokes 20 Little cigars daily, and a normal size cigar after breakfast and dinner.   Past/Anticipated interventions by otolaryngology, if any:  ESOPHAGOSCOPY (N/A) DIRECT LARYNGOSCOPY WITH BIOPSY (N/A) - DL WITH BIOPSY By Dr. Melida Quitter 07/12/15.   Past/Anticipated interventions by medical oncology, if any: He has an appointment with Dr. Alvy Bimler 11/9 at 12:30     Current Complaints / other details: He has severe pain in his Right Ear which he rates a 9/10. He is taking BC powder to relieve this pain during the day, but states it is not completely relieving his pain. Mr. Harts lives in the [facility] area and has concerns about traveling here for radiation treatments and his sister lives in [place], and has questions about him  receiving treatment there because of transportation issues.  No chief complaint on file.  BP 121/76 mmHg  Pulse 66  Temp(Src) 98.3 F (36.8 C)  Ht 6' (1.829 m)  Wt 231 lb 11.2 oz (105.098 kg)  BMI 31.42 kg/m2   Wt Readings from Last 3 Encounters:  07/26/15 231 lb 11.2 oz (105.098 kg)  07/12/15 227 lb (102.967 kg)  07/10/15 227 lb 4.7 oz (103.1 kg)

## 2015-07-25 ENCOUNTER — Encounter: Payer: Self-pay | Admitting: Hematology and Oncology

## 2015-07-25 ENCOUNTER — Telehealth: Payer: Self-pay | Admitting: *Deleted

## 2015-07-25 DIAGNOSIS — C329 Malignant neoplasm of larynx, unspecified: Secondary | ICD-10-CM | POA: Insufficient documentation

## 2015-07-25 NOTE — Telephone Encounter (Signed)
  Oncology Nurse Navigator Documentation   Navigator Encounter Type: Telephone (07/25/15 1232)   Treatment Phase: Other (07/25/15 1232)     Called patient to see if he had any questions prior to tomorrow's appts with Drs. Isidore Moos and [place].  He denied any questions, again reiterated his understanding of [place] location.    Gayleen Orem, RN, BSN, [place] at [place] [phone]                Time Spent with Patient: 15 (07/25/15 1232)

## 2015-07-26 ENCOUNTER — Encounter: Payer: Self-pay | Admitting: Radiation Oncology

## 2015-07-26 ENCOUNTER — Ambulatory Visit
Admission: RE | Admit: 2015-07-26 | Discharge: 2015-07-26 | Disposition: A | Discharge status: Home / Self Care | Payer: Medicare HMO | Source: Ambulatory Visit | Attending: Radiation Oncology | Admitting: Radiation Oncology

## 2015-07-26 ENCOUNTER — Telehealth: Payer: Self-pay | Admitting: *Deleted

## 2015-07-26 ENCOUNTER — Ambulatory Visit (HOSPITAL_BASED_OUTPATIENT_CLINIC_OR_DEPARTMENT_OTHER): Payer: Medicare HMO | Admitting: Hematology and Oncology

## 2015-07-26 ENCOUNTER — Encounter: Payer: Self-pay | Admitting: Hematology and Oncology

## 2015-07-26 ENCOUNTER — Encounter: Payer: Self-pay | Admitting: *Deleted

## 2015-07-26 VITALS — BP 121/76 | HR 66 | Temp 98.3°F | Ht 72.0 in | Wt 231.7 lb

## 2015-07-26 VITALS — BP 153/88 | HR 67 | Temp 98.2°F | Resp 19 | Ht 72.0 in | Wt 230.8 lb

## 2015-07-26 DIAGNOSIS — I2510 Atherosclerotic heart disease of native coronary artery without angina pectoris: Secondary | ICD-10-CM | POA: Diagnosis not present

## 2015-07-26 DIAGNOSIS — Z7982 Long term (current) use of aspirin: Secondary | ICD-10-CM | POA: Insufficient documentation

## 2015-07-26 DIAGNOSIS — C321 Malignant neoplasm of supraglottis: Secondary | ICD-10-CM

## 2015-07-26 DIAGNOSIS — F17290 Nicotine dependence, other tobacco product, uncomplicated: Secondary | ICD-10-CM | POA: Insufficient documentation

## 2015-07-26 DIAGNOSIS — R070 Pain in throat: Secondary | ICD-10-CM | POA: Insufficient documentation

## 2015-07-26 DIAGNOSIS — I10 Essential (primary) hypertension: Secondary | ICD-10-CM | POA: Diagnosis not present

## 2015-07-26 DIAGNOSIS — E039 Hypothyroidism, unspecified: Secondary | ICD-10-CM | POA: Diagnosis not present

## 2015-07-26 DIAGNOSIS — C329 Malignant neoplasm of larynx, unspecified: Secondary | ICD-10-CM

## 2015-07-26 DIAGNOSIS — I255 Ischemic cardiomyopathy: Secondary | ICD-10-CM | POA: Diagnosis not present

## 2015-07-26 DIAGNOSIS — I252 Old myocardial infarction: Secondary | ICD-10-CM | POA: Diagnosis not present

## 2015-07-26 MED ORDER — HYDROMORPHONE HCL 4 MG PO TABS: 4.0000 mg | ORAL_TABLET | ORAL | Status: AC | PRN

## 2015-07-26 NOTE — Assessment & Plan Note (Signed)
The patient had poorly controlled pain. I recommend a trial of Dilaudid. We discussed about the role of pain management in the oncology clinic. The prescribed pain regimen is for short term use only.   I will give him 90 tablets only. I discussed with him the risk, benefit, side effects including potential risk of sedation, nausea and constipation.

## 2015-07-26 NOTE — Progress Notes (Signed)
[facility] NOTE  Patient Care Team: Celedonio Savage, MD as PCP - General (Family Medicine) Leota Sauers, RN as Oncology Nurse Navigator Eppie Gibson, MD as Attending Physician (Radiation Oncology) Karie Mainland, RD as Dietitian (Nutrition)  CHIEF COMPLAINTS/PURPOSE OF CONSULTATION:  Supraglottic cancer  HISTORY OF PRESENTING ILLNESS:  Jerry Cardenas 63 y.o. male is here because of newly diagnosed supraglottic cancer According to the patient, the first initial presentation was due to hoarseness and severe throat pain. He has hoarseness for over 6 months. Multiple, he started to have dysphagia, odynophagia, pain around the right ear and severe throat pain. He rated his pain a 9 out of 10. He had recent choking sensation and half modify his diet to soft food. He has lost 25 pounds of weight. He has significant history of heart disease but denies recent chest pain or shortness of breath. He underwent multiple evaluations which are summarized as follows:   Laryngeal cancer ([place])   07/06/2015 Imaging CT scan of neck showed a supraglottic tumor mass extends from the level of the false cords superiorly to the right side of thevallecula. 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 The mass is at about the false cord    he denies any hearing deficit or difficulties with chewing food  MEDICAL HISTORY:  Past Medical History  Diagnosis Date  . Arthritis   . Hypertension   . Coronary artery disease     h/o CABG  . Myocardial infarction ([place])   . Hypothyroidism   . Anginal pain ([place])   . History of tobacco abuse    . Cancer ([place])     supraglottic cancer  . Dysphonia   . Dysphagia   . Wears glasses     SURGICAL HISTORY: Past Surgical History  Procedure Laterality Date  . Coronary artery bypass graft  07/17/2004    CABGx4 - LIMA to distal LAD, SVG to 1st diagonal, SVG to Cfx marginal, SVG to distal RCA (Dr. Remus Loffler)  . Cardiac catheterization  07/12/2004    LAD 70% narrowing, 95% mildly segmental stenosis of LAD beyond second diagonal, 95% stenosis of large prox inferior bifurcation branch of diagonal, Cfx had 60-70% stenosis, 50% irregularity and narrowing allong the prox band and another 60% narrowing of mid-portion, RV branch from prox 3rd with 95% prox stenosis (Dr. Marella Chimes) - subsequent CABG  . Back surgery    . Left heart catheterization with coronary angiogram N/A 10/14/2013    Procedure: LEFT HEART CATHETERIZATION WITH CORONARY ANGIOGRAM;  Surgeon: Troy Sine, MD;  Location: [facility] CATH LAB;  Service: Cardiovascular;  Laterality: N/A;  . Bilateral carpal tunnel release    . Esophagoscopy N/A 07/12/2015    Procedure: ESOPHAGOSCOPY;  Surgeon: Melida Quitter, MD;  Location: [facility] OR;  Service: ENT;  Laterality: N/A;  . Direct laryngoscopy N/A 07/12/2015    Procedure: DIRECT LARYNGOSCOPY WITH BIOPSY;  Surgeon: Melida Quitter, MD;  Location: [facility];  Service: ENT;  Laterality: N/A;  DL WITH BIOPSY  . Coronary angioplasty with stent placement      SOCIAL HISTORY: Social History   Social History  . Marital Status: Married  Spouse Name: N/A  . Number of Children: 2  . Years of Education: N/A   Occupational History  . Not on file.   Social History Main Topics  . Smoking status: Current Every Day Smoker -- 1.00 packs/day for 35 years    Types: Cigars  . Smokeless tobacco: Former Systems developer    Types: Chew  . Alcohol Use: No  . Drug Use: No  . Sexual Activity: No     Comment: divorced, 2 children, sister Peggye Ley MPOA   Other Topics Concern  . Not on file   Social History Narrative     FAMILY HISTORY: Family History  Problem Relation Age of Onset  . Leukemia Father   . Lung cancer Brother     ALLERGIES:  is allergic to penicillins.  MEDICATIONS:  Current Outpatient Prescriptions  Medication Sig Dispense Refill  . aspirin EC 81 MG EC tablet Take 1 tablet (81 mg total) by mouth daily.    Marland Kitchen atorvastatin (LIPITOR) 80 MG tablet Take 80 mg by mouth daily.    Marland Kitchen buPROPion (WELLBUTRIN) 75 MG tablet Take 75 mg by mouth 2 (two) times daily.    . carvedilol (COREG) 6.25 MG tablet Take 1 tablet (6.25 mg total) by mouth 2 (two) times daily with a meal. 60 tablet 11  . clopidogrel (PLAVIX) 75 MG tablet Take 1 tablet (75 mg total) by mouth daily. 30 tablet 6  . diclofenac (VOLTAREN) 75 MG EC tablet Take 75 mg by mouth 2 (two) times daily as needed for mild pain.    Marland Kitchen HYDROmorphone (DILAUDID) 4 MG tablet Take 1 tablet (4 mg total) by mouth every 4 (four) hours as needed for severe pain. 90 tablet 0  . isosorbide mononitrate (IMDUR) 15 mg TB24 24 hr tablet Take 0.5 tablets (15 mg total) by mouth daily. 45 tablet 3  . levothyroxine (SYNTHROID, LEVOTHROID) 150 MCG tablet Take 150 mcg by mouth daily before breakfast.    . nitroGLYCERIN (NITROSTAT) 0.4 MG SL tablet Place 1 tablet (0.4 mg total) under the tongue every 5 (five) minutes as needed for chest pain. 25 tablet 2  . vitamin E 400 UNIT capsule Take 400 Units by mouth daily.     No current facility-administered medications for this visit.    REVIEW OF SYSTEMS:   Constitutional: Denies fevers, chills or abnormal night sweats Eyes: Denies blurriness of vision, double vision or watery eyes Respiratory: Denies cough, dyspnea or wheezes Cardiovascular: Denies palpitation, chest discomfort or lower extremity swelling Gastrointestinal:  Denies nausea, heartburn or change in bowel habits Skin: Denies abnormal skin rashes Lymphatics: Denies new lymphadenopathy or easy bruising Neurological:Denies numbness, tingling or new  weaknesses Behavioral/Psych: Mood is stable, no new changes  All other systems were reviewed with the patient and are negative.  PHYSICAL EXAMINATION: ECOG PERFORMANCE STATUS: 1 - Symptomatic but completely ambulatory  Filed Vitals:   07/26/15 1223  BP: 153/88  Pulse: 67  Temp: 98.2 F (36.8 C)  Resp: 19   Filed Weights   07/26/15 1223  Weight: 230 lb 12.8 oz (104.69 kg)    GENERAL:alert, no distress and comfortable. He is morbidly obese SKIN: skin color, texture, turgor are normal, no rashes or significant lesions EYES: normal, conjunctiva are pink and non-injected, sclera clear OROPHARYNX:no exudate, no erythema and lips, buccal mucosa, and tongue normal  NECK: supple, thyroid normal size, non-tender, without nodularity LYMPH:  no palpable lymphadenopathy in the cervical, axillary or inguinal LUNGS: clear to auscultation and percussion with normal breathing  effort HEART: regular rate & rhythm and no murmurs and no lower extremity edema ABDOMEN:abdomen soft, non-tender and normal bowel sounds Musculoskeletal:no cyanosis of digits and no clubbing  PSYCH: alert & oriented x 3 with fluent speech NEURO: no focal motor/sensory deficits  LABORATORY DATA:  I have reviewed the data as listed Lab Results  Component Value Date   WBC 11.6* 07/10/2015   HGB 15.6 07/10/2015   HCT 45.4 07/10/2015   MCV 95.0 07/10/2015   PLT 252 07/10/2015   Lab Results  Component Value Date   NA 136 07/10/2015   K 4.4 07/10/2015   CL 104 07/10/2015   CO2 24 07/10/2015    RADIOGRAPHIC STUDIES: I have personally reviewed the radiological images as listed and agreed with the findings in the report. Ct Soft Tissue Neck W Contrast  07/06/2015  CLINICAL DATA:  Progressive difficulty speaking and swelling over the last 6 months. New diagnosis of supraglottic cancer. EXAM: CT NECK WITH CONTRAST TECHNIQUE: Multidetector CT imaging of the neck was performed using the standard protocol following the  bolus administration of intravenous contrast. CONTRAST:  24mL OMNIPAQUE IOHEXOL 300 MG/ML  SOLN COMPARISON:  None. FINDINGS: Pharynx and larynx: A a supraglottic tumor mass extends from the level of the false cords superiorly to the right side of the vallecula. The tumor measures at least 4.2 x 3.2 x 1.8 cm. A crosses midline anteriorly with irregular soft tissue mass along the underside of the epiglottis on the left as well. There is no definite extension into the vallecula on the left. There is no invasion of the thyroid cartilage. Salivary glands: Within normal limits Thyroid: Negative Lymph nodes: A a benign appearing elongated right jugulodigastric lymph node with a prominent fatty hilum measures 2.7 cm maximally. No significant right-sided adenopathy is present. No enlarged lymph nodes are present on the left. Vascular: Atherosclerotic calcifications are present at the carotid bifurcations bilaterally without significant stenosis relative to the more distal vessels. Limited intracranial: Negative Visualized orbits: Not visualized Mastoids and visualized paranasal sinuses: Mild mucosal thickening is present along the lateral and inferior aspect of the right maxillary sinus. The remaining paranasal sinuses and the mastoid air cells are clear. Skeleton: Endplate degenerative changes are most noted at C4-5 and C5-6 with osseous foraminal narrowing right greater than left at C4-5. No focal lytic or blastic lesions are present. The patient is edentulous. Median sternotomy is noted. Upper chest: Scarring or atelectasis is noted anteriorly in the left upper lobe. There is mild dependent atelectasis bilaterally. IMPRESSION: 1. Irregular right-sided supraglottic tumor extending from the false vocal cords superiorly to the level of the vallecula. The tumor measures 4.2 x 3.2 x 1.8 cm. The tumor does cross midline below the level of the epiglottis. 2. No significant adenopathy. 3. Atherosclerotic disease is evident at the  carotid bifurcations bilaterally without significant stenosis. 4. Degenerative changes of the cervical spine are most evident at C4-5 and C5-6. Electronically Signed   By: San Morelle M.D.   On: 07/06/2015 15:31    ASSESSMENT:  Newly diagnosed squamous cell carcinoma of the Head & Neck, HPV N/A  PLAN:  Laryngeal cancer ([place]) We discussed multidisciplinary approach to his care. Due to his insurance and living situation, he has been referred to a facility closer to home. I reviewed with him the current guidelines. Without staging information, it is hard to know if he would benefit from chemotherapy or not. He would need to complete workup was staging PET scan, to be arranged by  his future physician. In the meantime, I will give him medications to control his pain.  Throat pain The patient had poorly controlled pain. I recommend a trial of Dilaudid. We discussed about the role of pain management in the oncology clinic. The prescribed pain regimen is for short term use only.   I will give him 90 tablets only. I discussed with him the risk, benefit, side effects including potential risk of sedation, nausea and constipation.   Cardiomyopathy, ischemic EF 40% He has significant coronary artery disease and currently on maximum medical management. He denies recent chest pain. Continue medical management.     All questions were answered. The patient knows to call the clinic with any problems, questions or concerns. I spent 40 minutes counseling the patient face to face. The total time spent in the appointment was 55 minutes and more than 50% was on counseling.     [facility], [place], MD 07/26/2015 3:26 PM

## 2015-07-26 NOTE — Telephone Encounter (Signed)
Called patient to inform of appt. With Dr. Serita Grammes in [place] on 08-04-15 - arrival time - 9 am, spoke with patient and he is aware of this appt.

## 2015-07-26 NOTE — Assessment & Plan Note (Signed)
We discussed multidisciplinary approach to his care. Due to his insurance and living situation, he has been referred to a facility closer to home. I reviewed with him the current guidelines. Without staging information, it is hard to know if he would benefit from chemotherapy or not. He would need to complete workup was staging PET scan, to be arranged by his future physician. In the meantime, I will give him medications to control his pain.

## 2015-07-26 NOTE — Progress Notes (Signed)
  Oncology Nurse Navigator Documentation   Navigator Encounter Type: Initial MedOnc (07/26/15 1226)     Met with patient and his sister during New Consult appt with Dr. Alvy Bimler. They verbalized understanding of her discussion of CT results, the role of chemotherapy to treat his glottic cancer.  He indicated he did not want chemotherapy.  I later further explained likely SEs relative to benefit gained from adding chemotherapy to his tmt regime. He understands a medonc referral can be made to a [place], [place], cancer center should he change his mind.  Gayleen Orem, RN, BSN, [place] at [place] [phone]                    Time Spent with Patient: 30 (07/26/15 1226)

## 2015-07-26 NOTE — Addendum Note (Signed)
Encounter addended by: Ernst Spell, RN on: 07/26/2015  1:48 PM<BR>     Documentation filed: Charges VN

## 2015-07-26 NOTE — Progress Notes (Signed)
  Oncology Nurse Navigator Documentation   Navigator Encounter Type: Initial RadOnc (07/26/15 1005)     Barriers/Navigation Needs: Education (07/26/15 1005)     Met with patient during initial consult with Dr. Isidore Moos. He was accompanied by his sister.   1. Further introduced myself as his Navigator, explained my role as a member of the Care Team.   2. Provided New Patient Information packet, discussed contents:  Contact information for physician(s), myself, other members of the Care Team.  Advance Directive information ([place] blue pamphlet with LCSW contact info)  Fall Prevention Patient Safety Plan  Appointment Guideline  Financial Assistance Information sheet  ACS Referral form  [facility] campus map with highlight of [place] 3. Provided introductory explanation of radiation treatment including SIM planning and purpose of Aquaplast head and shoulder mask, showed them example.   4. He indicated he prefers to be treated near his sister's home in [place], [place], as he will be staying with her during tmt.  I arranged for him to meet with Jodelle Green, [employer], to review his insurance coverage following appt with Dr. Isidore Moos. 5. I encouraged them to contact me with questions. 6. They verbalized understanding of information provided.    Gayleen Orem, RN, BSN, [place] at [place] [phone]              Time Spent with Patient: 45 (07/26/15 1005)

## 2015-07-26 NOTE — Assessment & Plan Note (Signed)
He has significant coronary artery disease and currently on maximum medical management. He denies recent chest pain. Continue medical management.

## 2015-07-26 NOTE — Progress Notes (Signed)
Radiation Oncology         [phone] ________________________________  Initial outpatient Consultation  Name: Jerry Cardenas MRN: [id number]  Date: 07/26/2015  DOB: [birth year]  OE:VOJJK, Selinda Flavin, MD  Melida Quitter, MD   REFERRING PHYSICIAN: Melida Quitter, MD  DIAGNOSIS:    ICD-9-CM ICD-10-CM   1. 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 male who presented with right sided sore throat for 6 months and dysphagia for a few weeks. The patient had pain that radiated to his right ear, as well as hoarseness for 3 months. He has not lost weight and modifies his diet to soft foods. He reports occasional choking. The patient is edentulous. He smokes 20 little cigars daily and also has a normal sized cigar twice a day.   Dr. Redmond Baseman ordered a CT of the neck with contrast on 07/06/15, this revealed a right sided supraglottic tumor extending from false vocal cords to the  vallecula. It is 4.2 cm in greatest dimension. No significant adenopathy.   For his symptoms, the patient saw Dr. Redmond Baseman who performed biopsy and direct laryngoscopy on 07/12/15. 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 On laryngoscopy, he appreciated a mass of the right supraglottic larynx involving the laryngeal surface.   He is accompanied by his sister today. He lives in [facility], driving 98 miles to get here. His sister lives in [place], [place] which is 45-50 miles from him. He would not like chemotherapy. [place] would be half way for him to here. He has [employer] and is unsure if he would be allowed to receive treatment out of state and closer to his home. He previously lived in [place] and only recently moved away. [place] [facility] is 10 minutes from his sister's house and [place] is about 5 minutes from her house.  Swallowing issues,  if any: Yes; He has been modifying his diet, mashed potato, scrambled egg (food with a soft texture). He is able to chew, but gets choked occasionally.   Weight Changes: No; He reports gaining weight recently.  Pain status: He has severe pain in his Right Ear which he rates a 9/10. He is taking BC powder to relieve this pain during the day, but states it is not completely relieving his pain.   Other symptoms: No breathing issues. His major complaint is the ear ache.      PREVIOUS RADIATION THERAPY: No  PAST MEDICAL HISTORY:  has a past medical history of Arthritis; Hypertension; Coronary artery disease; Myocardial infarction ([place]); Hypothyroidism; Anginal pain ([place]); History of tobacco abuse; Cancer ([place]); Dysphonia; Dysphagia; and Wears glasses.    PAST SURGICAL HISTORY: Past Surgical History  Procedure Laterality Date  . Coronary artery bypass graft  07/17/2004    CABGx4 - LIMA to distal LAD, SVG to 1st diagonal, SVG to Cfx marginal, SVG to distal RCA (Dr. Remus Loffler)  . Cardiac catheterization  07/12/2004    LAD 70% narrowing, 95% mildly segmental stenosis of LAD beyond second diagonal, 95% stenosis of large prox inferior bifurcation branch of diagonal, Cfx had 60-70% stenosis, 50% irregularity and narrowing allong the prox band and another 60% narrowing of mid-portion, RV branch from prox 3rd with 95% prox stenosis (Dr. Marella Chimes) - subsequent CABG  . Back surgery    . Left heart catheterization with coronary angiogram N/A 10/14/2013  Procedure: LEFT HEART CATHETERIZATION WITH CORONARY ANGIOGRAM;  Surgeon: Troy Sine, MD;  Location: [facility] CATH LAB;  Service: Cardiovascular;  Laterality: N/A;  . Bilateral carpal tunnel release    . Esophagoscopy N/A 07/12/2015    Procedure: ESOPHAGOSCOPY;  Surgeon: Melida Quitter, MD;  Location: [facility] OR;  Service: ENT;  Laterality: N/A;  . Direct laryngoscopy N/A 07/12/2015    Procedure: DIRECT LARYNGOSCOPY WITH BIOPSY;  Surgeon: Melida Quitter, MD;   Location: [facility];  Service: ENT;  Laterality: N/A;  DL WITH BIOPSY  . Coronary angioplasty with stent placement      FAMILY HISTORY: family history includes Leukemia in his father; Lung cancer in his brother.  SOCIAL HISTORY:  reports that he has been smoking Cigars.  He has quit using smokeless tobacco. His smokeless tobacco use included Chew. He reports that he does not drink alcohol or use illicit drugs.  ALLERGIES: Penicillins  MEDICATIONS:  Current Outpatient Prescriptions  Medication Sig Dispense Refill  . aspirin EC 81 MG EC tablet Take 1 tablet (81 mg total) by mouth daily.    Marland Kitchen atorvastatin (LIPITOR) 80 MG tablet Take 80 mg by mouth daily.    . carvedilol (COREG) 6.25 MG tablet Take 1 tablet (6.25 mg total) by mouth 2 (two) times daily with a meal. 60 tablet 11  . clopidogrel (PLAVIX) 75 MG tablet Take 1 tablet (75 mg total) by mouth daily. 30 tablet 6  . diclofenac (VOLTAREN) 75 MG EC tablet Take 75 mg by mouth 2 (two) times daily as needed for mild pain.    . isosorbide mononitrate (IMDUR) 15 mg TB24 24 hr tablet Take 0.5 tablets (15 mg total) by mouth daily. 45 tablet 3  . levothyroxine (SYNTHROID, LEVOTHROID) 150 MCG tablet Take 150 mcg by mouth daily before breakfast.    . nitroGLYCERIN (NITROSTAT) 0.4 MG SL tablet Place 1 tablet (0.4 mg total) under the tongue every 5 (five) minutes as needed for chest pain. 25 tablet 2  . vitamin E 400 UNIT capsule Take 400 Units by mouth daily.    Marland Kitchen buPROPion (WELLBUTRIN) 75 MG tablet Take 75 mg by mouth 2 (two) times daily.    Marland Kitchen HYDROmorphone (DILAUDID) 4 MG tablet Take 1 tablet (4 mg total) by mouth every 4 (four) hours as needed for severe pain. 90 tablet 0   No current facility-administered medications for this encounter.    REVIEW OF SYSTEMS:  Notable for that above.   PHYSICAL EXAM:  height is 6' (1.829 m) and weight is 231 lb 11.2 oz (105.098 kg). His temperature is 98.3 F (36.8 C). His blood pressure is 121/76 and his pulse is  66.   General: Alert and oriented, in no acute distress. No stridor. HEENT: Head is normocephalic. Extraocular movements are intact. Oropharynx is notable for no oropharyngeal lesions. Mucous membranes are moist without thrush.   Endentulous. Neck: Neck is notable for no palpable cervicle or supraclavicular adenopathy. Heart: Regular in rate and rhythm with no murmurs, rubs, or gallops. Chest: Clear to auscultation bilaterally, with no rhonchi, wheezes, or rales. Abdomen: Soft, nontender, nondistended, with no rigidity or guarding. Extremities: No cyanosis or edema. Lymphatics: see Neck Exam Skin: No concerning lesions. Musculoskeletal: symmetric strength and muscle tone throughout. Neurologic: Cranial nerves II through XII are grossly intact. No obvious focalities. Speech is fluent. Coordination is intact. Psychiatric: Judgment and insight are intact. Affect is appropriate.  Laryngoscopy not done due to likely referral to outside rad/onc MD   ECOG = 1  0 - Asymptomatic (Fully active, able to carry on all predisease activities without restriction)  1 - Symptomatic but completely ambulatory (Restricted in physically strenuous activity but ambulatory and able to carry out work of a light or sedentary nature. For example, light housework, office work)  2 - Symptomatic, <50% in bed during the day (Ambulatory and capable of all self care but unable to carry out any work activities. Up and about more than 50% of waking hours)  3 - Symptomatic, >50% in bed, but not bedbound (Capable of only limited self-care, confined to bed or chair 50% or more of waking hours)  4 - Bedbound (Completely disabled. Cannot carry on any self-care. Totally confined to bed or chair)  5 - Death   Eustace Pen MM, Creech RH, Tormey DC, et al. [phone]). "Toxicity and response criteria of the [facility]". [place] Oncol. 5 (6): 649-55   LABORATORY DATA:  Lab Results  Component Value Date   WBC  11.6* 07/10/2015   HGB 15.6 07/10/2015   HCT 45.4 07/10/2015   MCV 95.0 07/10/2015   PLT 252 07/10/2015   CMP     Component Value Date/Time   NA 136 07/10/2015 1129   K 4.4 07/10/2015 1129   CL 104 07/10/2015 1129   CO2 24 07/10/2015 1129   GLUCOSE 86 07/10/2015 1129   BUN 16 07/10/2015 1129   CREATININE 1.00 07/10/2015 1129   CALCIUM 9.6 07/10/2015 1129   GFRNONAA >60 07/10/2015 1129   GFRAA >60 07/10/2015 1129         RADIOGRAPHY: Ct Soft Tissue Neck W Contrast  07/06/2015  CLINICAL DATA:  Progressive difficulty speaking and swelling over the last 6 months. New diagnosis of supraglottic cancer. EXAM: CT NECK WITH CONTRAST TECHNIQUE: Multidetector CT imaging of the neck was performed using the standard protocol following the bolus administration of intravenous contrast. CONTRAST:  26mL OMNIPAQUE IOHEXOL 300 MG/ML  SOLN COMPARISON:  None. FINDINGS: Pharynx and larynx: A a supraglottic tumor mass extends from the level of the false cords superiorly to the right side of the vallecula. The tumor measures at least 4.2 x 3.2 x 1.8 cm. A crosses midline anteriorly with irregular soft tissue mass along the underside of the epiglottis on the left as well. There is no definite extension into the vallecula on the left. There is no invasion of the thyroid cartilage. Salivary glands: Within normal limits Thyroid: Negative Lymph nodes: A a benign appearing elongated right jugulodigastric lymph node with a prominent fatty hilum measures 2.7 cm maximally. No significant right-sided adenopathy is present. No enlarged lymph nodes are present on the left. Vascular: Atherosclerotic calcifications are present at the carotid bifurcations bilaterally without significant stenosis relative to the more distal vessels. Limited intracranial: Negative Visualized orbits: Not visualized Mastoids and visualized paranasal sinuses: Mild mucosal thickening is present along the lateral and inferior aspect of the right  maxillary sinus. The remaining paranasal sinuses and the mastoid air cells are clear. Skeleton: Endplate degenerative changes are most noted at C4-5 and C5-6 with osseous foraminal narrowing right greater than left at C4-5. No focal lytic or blastic lesions are present. The patient is edentulous. Median sternotomy is noted. Upper chest: Scarring or atelectasis is noted anteriorly in the left upper lobe. There is mild dependent atelectasis bilaterally. IMPRESSION: 1. Irregular right-sided supraglottic tumor extending from the false vocal cords superiorly to the level of the vallecula. The tumor measures 4.2 x 3.2 x 1.8 cm. The tumor does cross midline  below the level of the epiglottis. 2. No significant adenopathy. 3. Atherosclerotic disease is evident at the carotid bifurcations bilaterally without significant stenosis. 4. Degenerative changes of the cervical spine are most evident at C4-5 and C5-6. Electronically Signed   By: San Morelle M.D.   On: 07/06/2015 15:31      IMPRESSION/PLAN:  This is a delightful patient with head and neck cancer. I would recommend radiotherapy for this patient.  We discussed the purpose of radiotherapy and general logistics of treatment planning and treatment delivery.  The patient would like to receive treatment close to home in [place], [place]. He is concerned about insurance coverage and our financial counselor will talk to him about whether he is eligible for treatment in [place]. If feasible, I will refer him to Dr. Serita Grammes in [place], [place] at [facility] pending information from our financial counselor. If he does not have insurance coverage in [place] I will alternatively schedule for him to follow up with me at our satellite office in [place], [place].  He has an appointment in medical oncology today with Dr. Alvy Bimler at 12:30  __________________________________________   Eppie Gibson, MD    This document serves as a record of services  personally performed by Eppie Gibson, MD. It was created on her behalf by Arlyce Harman, a trained medical scribe. The creation of this record is based on the scribe's personal observations and the provider's statements to them. This document has been checked and approved by the attending provider.

## 2016-11-02 IMAGING — CT CT NECK W/ CM
4 of 5 series · 16 of 33 positions shown, 18 images · IV contrast (OMNIPAQUE)
Comparison: None.

CLINICAL DATA: Progressive difficulty speaking and swelling over
the last 6 months. New diagnosis of supraglottic cancer.

EXAM:
CT NECK WITH CONTRAST
TECHNIQUE: Multidetector CT imaging of the neck was performed using the
standard protocol following the bolus administration of intravenous
contrast.
CONTRAST:  75mL OMNIPAQUE IOHEXOL 300 MG/ML  SOLN

[Series 2: neck st · axial · 0.48mm/px · z∈[-255,-103]mm · 4 of 128 slices shown]
[im 26/128  bone]
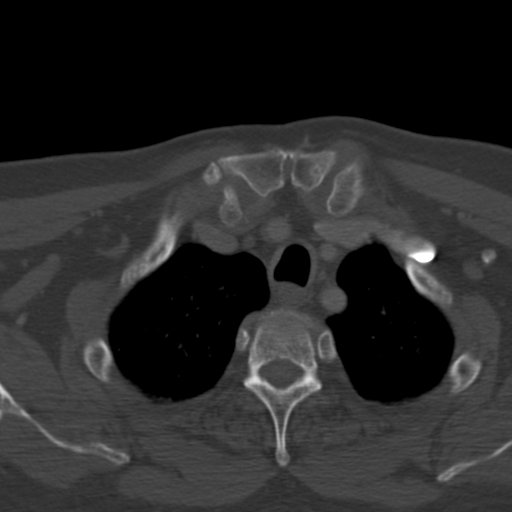
[im 51/128  bone]
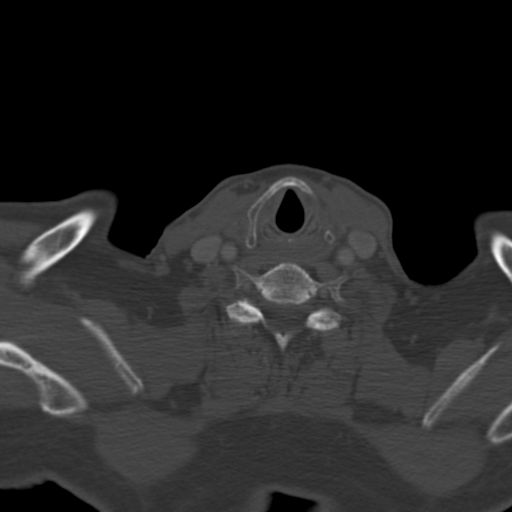
[im 77/128  bone]
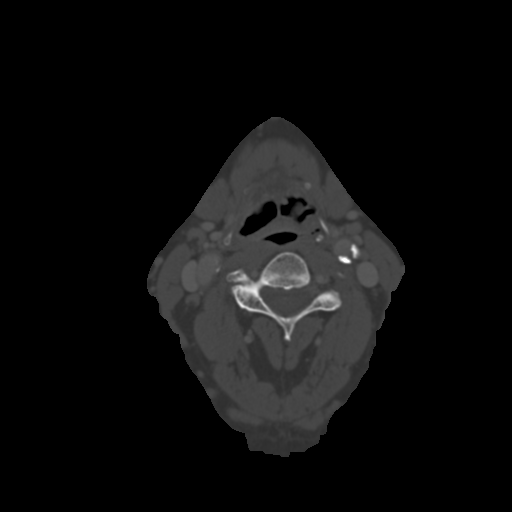
[im 102/128  bone]
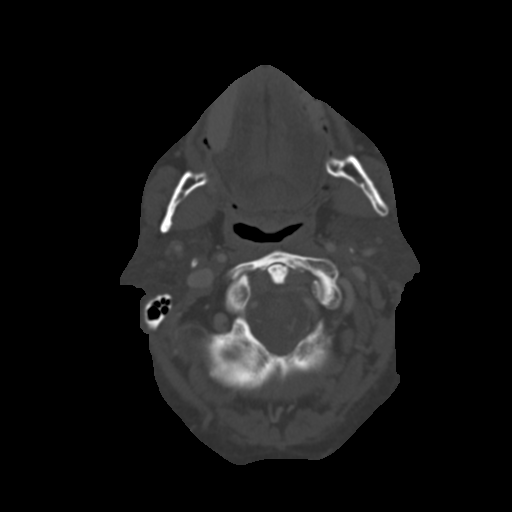

[Series 602: <mpr thick range> · sagittal · 0.50mm/px · 5 of 73 slices shown, 6 images]
[im 25/73  bone]
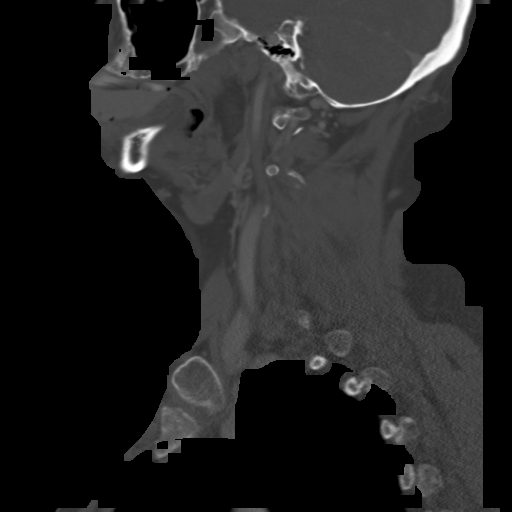
[im 31/73  bone]
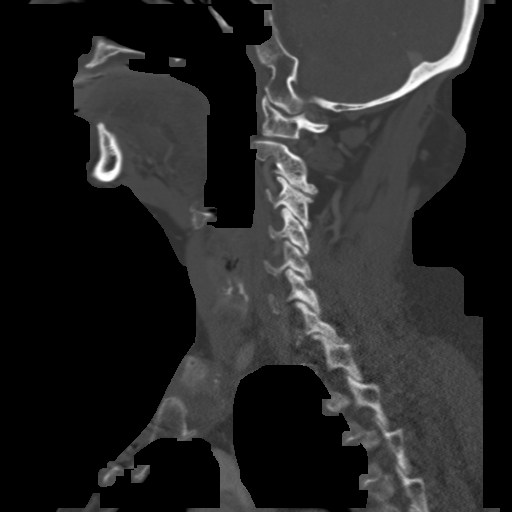
[im 37/73  soft-tissue]
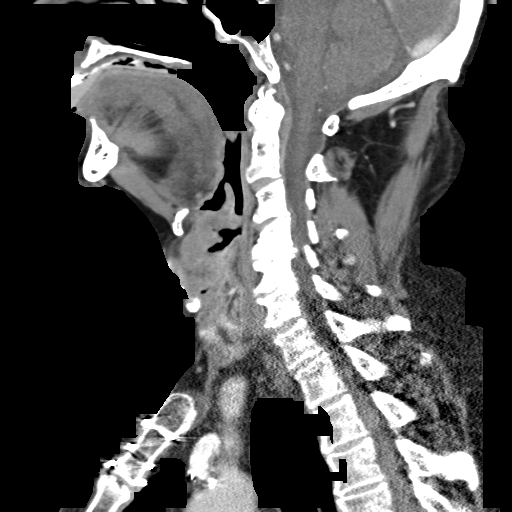
[im 37/73  bone]
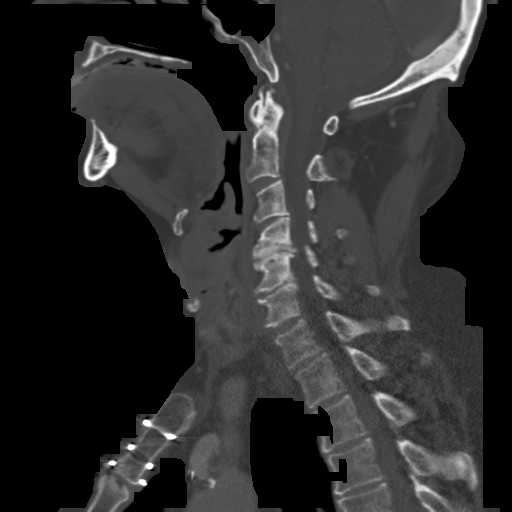
[im 43/73  bone]
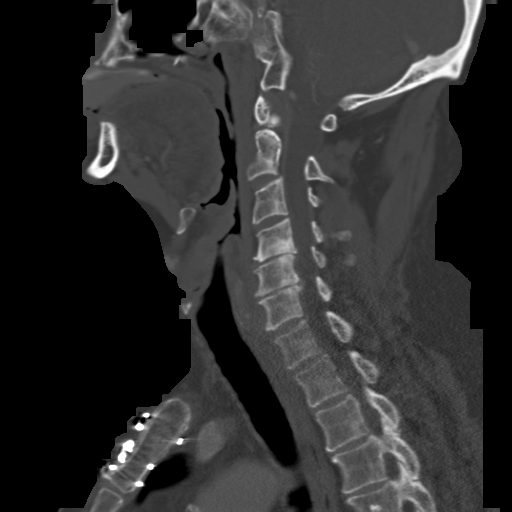
[im 49/73  bone]
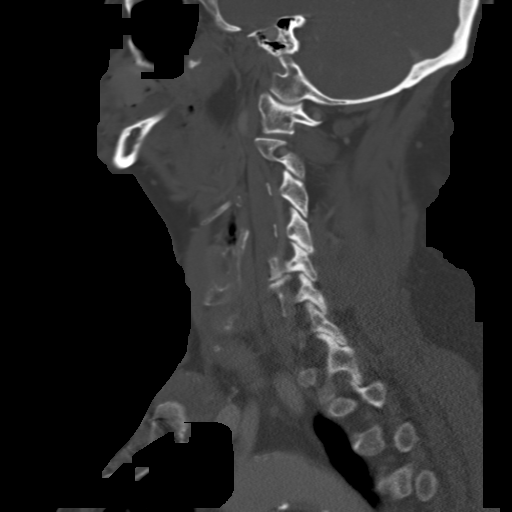

[Series 603: <mpr thick range(1)> · coronal · 0.50mm/px · 3 of 95 slices shown]
[im 19/95  bone]
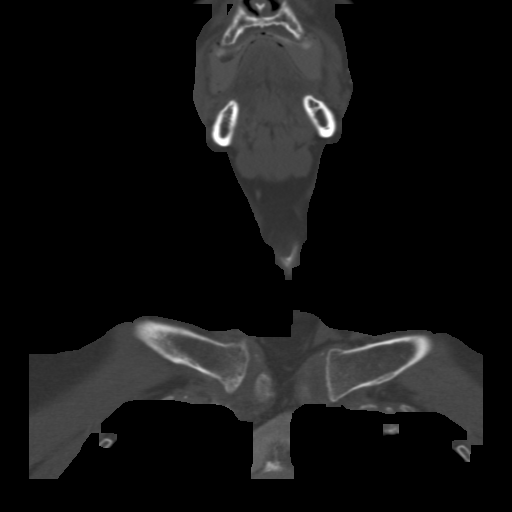
[im 38/95  bone]
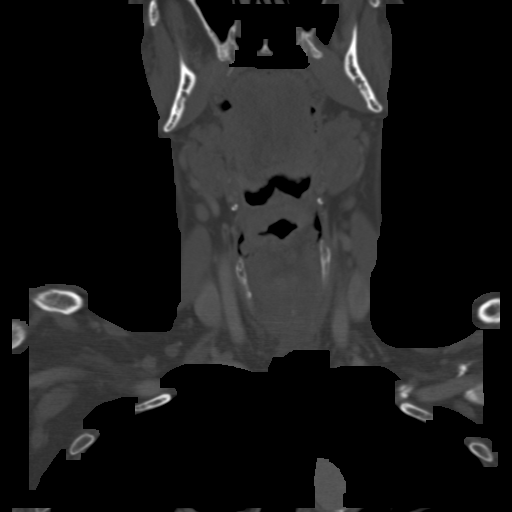
[im 57/95  bone]
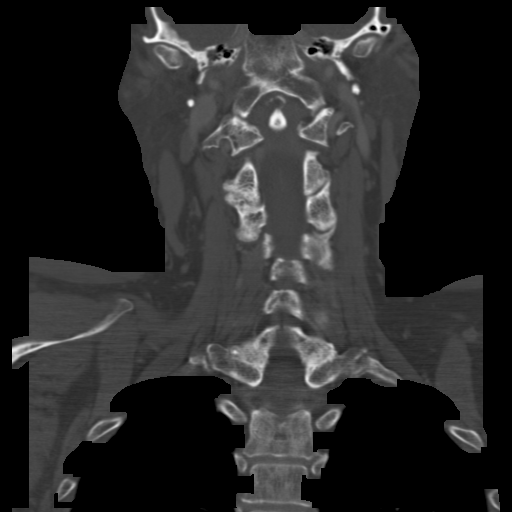

[Series 604: <mpr thick range(2)> · axial · 0.50mm/px · z∈[-298,-128]mm · 4 of 147 slices shown, 5 images]
[im 30/147  soft-tissue]
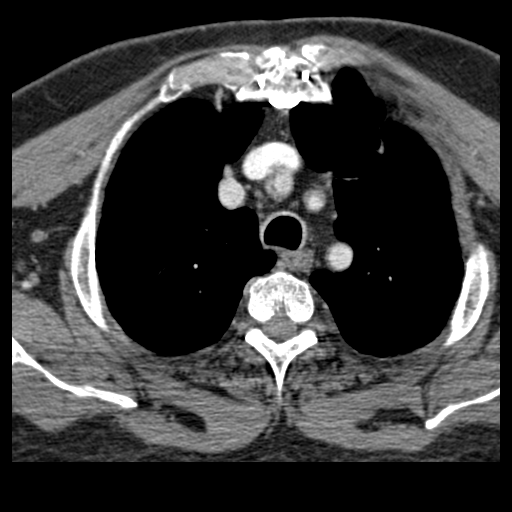
[im 30/147  bone]
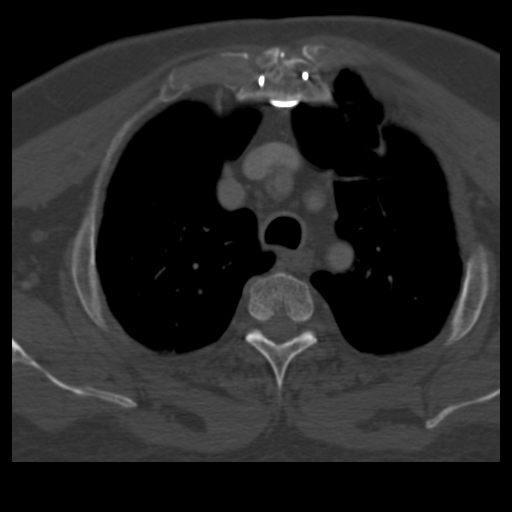
[im 59/147  bone]
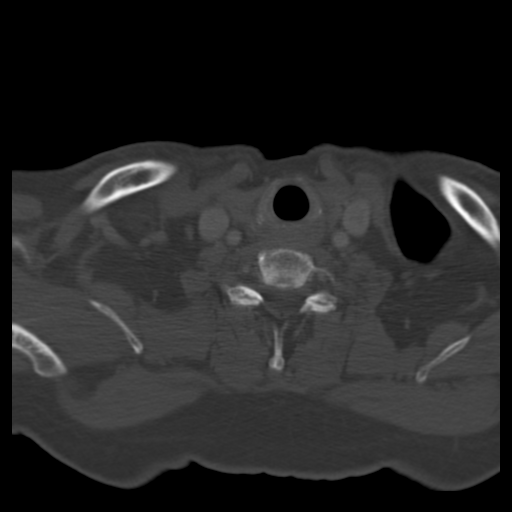
[im 88/147  bone]
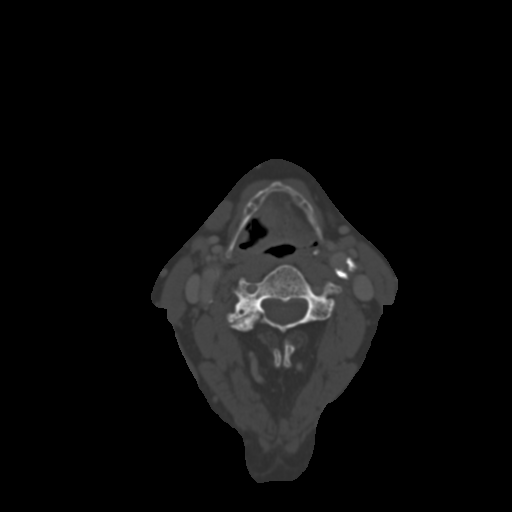
[im 117/147  bone]
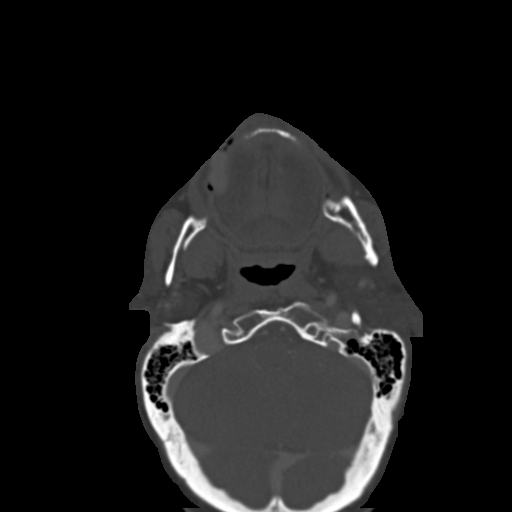

[16 of 33 positions shown; findings below may reference images not displayed]

FINDINGS: Pharynx and larynx: A a supraglottic tumor mass extends from the
level of the false cords superiorly to the right side of the
vallecula. The tumor measures at least 4.2 x 3.2 x 1.8 cm. A crosses
midline anteriorly with irregular soft tissue mass along the
underside of the epiglottis on the left as well. There is no
definite extension into the vallecula on the left.

There is no invasion of the thyroid cartilage.

Salivary glands: Within normal limits

Thyroid: Negative

Lymph nodes: A a benign appearing elongated right jugulodigastric
lymph node with a prominent fatty hilum measures 2.7 cm maximally.
No significant right-sided adenopathy is present. No enlarged lymph
nodes are present on the left.

Vascular: Atherosclerotic calcifications are present at the carotid
bifurcations bilaterally without significant stenosis relative to
the more distal vessels.

Limited intracranial: Negative

Visualized orbits: Not visualized

Mastoids and visualized paranasal sinuses: Mild mucosal thickening
is present along the lateral and inferior aspect of the right
maxillary sinus. The remaining paranasal sinuses and the mastoid air
cells are clear.

Skeleton: Endplate degenerative changes are most noted at C4-5 and
C5-6 with osseous foraminal narrowing right greater than left at
C4-5. No focal lytic or blastic lesions are present. The patient is
edentulous. Median sternotomy is noted.

Upper chest: Scarring or atelectasis is noted anteriorly in the left
upper lobe. There is mild dependent atelectasis bilaterally.
IMPRESSION: 1. Irregular right-sided supraglottic tumor extending from the false
vocal cords superiorly to the level of the vallecula. The tumor
measures 4.2 x 3.2 x 1.8 cm. The tumor does cross midline below the
level of the epiglottis.
2. No significant adenopathy.
3. Atherosclerotic disease is evident at the carotid bifurcations
bilaterally without significant stenosis.
4. Degenerative changes of the cervical spine are most evident at
C4-5 and C5-6.
